# Patient Record
Sex: Female | Born: 1955 | Race: White | Hispanic: No | Marital: Married | Smoking: Never smoker
Health system: Southern US, Community
[De-identification: ages and names within clinical notes are randomized; demographics above are authoritative.]

## PROBLEM LIST (undated history)

## (undated) DIAGNOSIS — F32A Depression, unspecified: Secondary | ICD-10-CM

## (undated) DIAGNOSIS — F329 Major depressive disorder, single episode, unspecified: Secondary | ICD-10-CM

## (undated) DIAGNOSIS — F988 Other specified behavioral and emotional disorders with onset usually occurring in childhood and adolescence: Secondary | ICD-10-CM

## (undated) DIAGNOSIS — Z8619 Personal history of other infectious and parasitic diseases: Secondary | ICD-10-CM

## (undated) DIAGNOSIS — K219 Gastro-esophageal reflux disease without esophagitis: Secondary | ICD-10-CM

## (undated) DIAGNOSIS — G479 Sleep disorder, unspecified: Secondary | ICD-10-CM

## (undated) DIAGNOSIS — C73 Malignant neoplasm of thyroid gland: Secondary | ICD-10-CM

## (undated) DIAGNOSIS — M1990 Unspecified osteoarthritis, unspecified site: Secondary | ICD-10-CM

## (undated) DIAGNOSIS — F419 Anxiety disorder, unspecified: Secondary | ICD-10-CM

## (undated) HISTORY — PX: TONSILLECTOMY: SUR1361

---

## 1997-10-17 ENCOUNTER — Ambulatory Visit (HOSPITAL_COMMUNITY): Admission: RE | Admit: 1997-10-17 | Discharge: 1997-10-17 | Payer: Self-pay

## 1998-09-11 ENCOUNTER — Other Ambulatory Visit: Admission: RE | Admit: 1998-09-11 | Discharge: 1998-09-11 | Payer: Self-pay | Admitting: Obstetrics and Gynecology

## 1999-01-09 ENCOUNTER — Ambulatory Visit (HOSPITAL_COMMUNITY): Admission: RE | Admit: 1999-01-09 | Discharge: 1999-01-09 | Payer: Self-pay | Admitting: Obstetrics and Gynecology

## 1999-01-09 ENCOUNTER — Encounter: Payer: Self-pay | Admitting: Obstetrics and Gynecology

## 1999-10-30 ENCOUNTER — Other Ambulatory Visit: Admission: RE | Admit: 1999-10-30 | Discharge: 1999-10-30 | Payer: Self-pay | Admitting: Obstetrics and Gynecology

## 2000-01-13 ENCOUNTER — Encounter: Payer: Self-pay | Admitting: Internal Medicine

## 2000-01-13 ENCOUNTER — Ambulatory Visit (HOSPITAL_COMMUNITY): Admission: RE | Admit: 2000-01-13 | Discharge: 2000-01-13 | Payer: Self-pay | Admitting: Internal Medicine

## 2000-02-10 ENCOUNTER — Encounter: Admission: RE | Admit: 2000-02-10 | Discharge: 2000-02-10 | Payer: Self-pay | Admitting: Internal Medicine

## 2000-02-10 ENCOUNTER — Encounter: Payer: Self-pay | Admitting: Internal Medicine

## 2000-04-16 ENCOUNTER — Encounter: Payer: Self-pay | Admitting: Gastroenterology

## 2000-04-16 ENCOUNTER — Encounter: Admission: RE | Admit: 2000-04-16 | Discharge: 2000-04-16 | Payer: Self-pay | Admitting: Gastroenterology

## 2000-06-24 ENCOUNTER — Ambulatory Visit (HOSPITAL_COMMUNITY): Admission: RE | Admit: 2000-06-24 | Discharge: 2000-06-24 | Payer: Self-pay | Admitting: Gastroenterology

## 2000-06-24 ENCOUNTER — Encounter (INDEPENDENT_AMBULATORY_CARE_PROVIDER_SITE_OTHER): Payer: Self-pay | Admitting: Specialist

## 2000-08-06 ENCOUNTER — Encounter: Admission: RE | Admit: 2000-08-06 | Discharge: 2000-08-06 | Payer: Self-pay | Admitting: *Deleted

## 2000-08-06 ENCOUNTER — Encounter: Payer: Self-pay | Admitting: Allergy and Immunology

## 2001-01-03 ENCOUNTER — Other Ambulatory Visit: Admission: RE | Admit: 2001-01-03 | Discharge: 2001-01-03 | Payer: Self-pay | Admitting: Obstetrics and Gynecology

## 2001-02-17 ENCOUNTER — Encounter: Payer: Self-pay | Admitting: Obstetrics and Gynecology

## 2001-02-17 ENCOUNTER — Ambulatory Visit (HOSPITAL_COMMUNITY): Admission: RE | Admit: 2001-02-17 | Discharge: 2001-02-17 | Payer: Self-pay | Admitting: Obstetrics and Gynecology

## 2001-03-25 ENCOUNTER — Encounter: Payer: Self-pay | Admitting: Otolaryngology

## 2001-03-25 ENCOUNTER — Encounter: Admission: RE | Admit: 2001-03-25 | Discharge: 2001-03-25 | Payer: Self-pay | Admitting: Otolaryngology

## 2002-03-29 ENCOUNTER — Other Ambulatory Visit: Admission: RE | Admit: 2002-03-29 | Discharge: 2002-03-29 | Payer: Self-pay | Admitting: Obstetrics and Gynecology

## 2002-05-03 ENCOUNTER — Ambulatory Visit (HOSPITAL_COMMUNITY): Admission: RE | Admit: 2002-05-03 | Discharge: 2002-05-03 | Payer: Self-pay | Admitting: Obstetrics and Gynecology

## 2002-05-03 ENCOUNTER — Encounter: Payer: Self-pay | Admitting: Obstetrics and Gynecology

## 2003-02-26 ENCOUNTER — Encounter: Admission: RE | Admit: 2003-02-26 | Discharge: 2003-04-13 | Payer: Self-pay | Admitting: Internal Medicine

## 2003-05-29 ENCOUNTER — Other Ambulatory Visit: Admission: RE | Admit: 2003-05-29 | Discharge: 2003-05-29 | Payer: Self-pay | Admitting: Obstetrics and Gynecology

## 2003-12-13 ENCOUNTER — Encounter: Admission: RE | Admit: 2003-12-13 | Discharge: 2003-12-13 | Payer: Self-pay | Admitting: Obstetrics and Gynecology

## 2004-11-19 ENCOUNTER — Encounter: Admission: RE | Admit: 2004-11-19 | Discharge: 2004-11-19 | Payer: Self-pay | Admitting: Internal Medicine

## 2005-06-16 ENCOUNTER — Ambulatory Visit: Payer: Self-pay | Admitting: Gastroenterology

## 2005-08-07 ENCOUNTER — Ambulatory Visit (HOSPITAL_COMMUNITY): Admission: RE | Admit: 2005-08-07 | Discharge: 2005-08-07 | Payer: Self-pay | Admitting: Gastroenterology

## 2005-08-07 ENCOUNTER — Encounter (INDEPENDENT_AMBULATORY_CARE_PROVIDER_SITE_OTHER): Payer: Self-pay | Admitting: *Deleted

## 2005-12-23 ENCOUNTER — Encounter: Admission: RE | Admit: 2005-12-23 | Discharge: 2005-12-23 | Payer: Self-pay | Admitting: Obstetrics and Gynecology

## 2007-06-06 ENCOUNTER — Encounter: Admission: RE | Admit: 2007-06-06 | Discharge: 2007-06-06 | Payer: Self-pay | Admitting: Internal Medicine

## 2008-07-13 ENCOUNTER — Encounter: Admission: RE | Admit: 2008-07-13 | Discharge: 2008-07-13 | Payer: Self-pay | Admitting: Obstetrics and Gynecology

## 2009-08-13 ENCOUNTER — Encounter: Admission: RE | Admit: 2009-08-13 | Discharge: 2009-08-13 | Payer: Self-pay | Admitting: Internal Medicine

## 2010-12-17 ENCOUNTER — Other Ambulatory Visit: Payer: Self-pay | Admitting: Internal Medicine

## 2010-12-17 DIAGNOSIS — Z1231 Encounter for screening mammogram for malignant neoplasm of breast: Secondary | ICD-10-CM

## 2011-01-05 ENCOUNTER — Ambulatory Visit
Admission: RE | Admit: 2011-01-05 | Discharge: 2011-01-05 | Disposition: A | Discharge status: Home / Self Care | Payer: 59 | Source: Ambulatory Visit | Attending: Internal Medicine | Admitting: Internal Medicine

## 2011-01-05 DIAGNOSIS — Z1231 Encounter for screening mammogram for malignant neoplasm of breast: Secondary | ICD-10-CM

## 2011-01-30 NOTE — Procedures (Signed)
[facility]  Patient:    Mary Key, Mary Key                           MRN: [id number] Proc. Date: 06/24/00 Attending:  Verlin Grills, M.D.                           Procedure Report  PROCEDURE:  Esophagogastroduodenoscopy with gastric biopsy.  REFERRING PHYSICIAN:  Darius Bump, M.D., [facility]  PROCEDURE INDICATION:  Mary Key is a 55 year old female.  She takes Prilosec 20 mg daily to control epigastric dyspeptic-type symptoms.  If she does not take Prilosec, her epigastric discomfort worsens.  She denies dysphagia, odynophagia.  Heartburn is infrequent.  Feb 10, 2000, barium swallow, upper GI x-ray series revealed "heavy folds" in the gastric body and fundus of the stomach and several small polyps in the body of the stomach.  There was no evidence of a hiatal hernia.  There was no evidence of a radiologic-induced gastroesophageal reflux.  July 2001 abdominal ultrasound was normal.  I discussed with Ms. Hustead the complications associated with esophagogastroduodenoscopy including intestinal bleeding and intestinal perforation.  Ms. Nobile has signed the operative permit.  PAST MEDICAL HISTORY:  Chronic viral hepatitis C with normal liver enzymes. Bee sting anaphylaxis, depression, allergic rhinitis/sinusitis, uterine fibroids, tonsillectomy as a child.  CHRONIC MEDICATIONS:  Prozac, Prilosec, Nasonex, Allegra, Vioxx, and glucosamine.  ENDOSCOPIST:  Verlin Grills, M.D.  PREMEDICATION:  Demerol 50 mg, Versed 7 mg.  ENDOSCOPE:  Olympus gastroscope.  DESCRIPTION OF PROCEDURE:  After obtaining informed consent, the patient was placed in the left lateral decubitus position.  I administered intravenous Demerol and intravenous Versed to achieve conscious sedation for the procedure.  The patients blood pressure, oxygen saturation, and cardiac rhythm were monitored throughout the procedure and documented in the  record.  The Olympus gastroscope was passed into the posterior hypopharynx into the proximal esophagus without difficulty.  The hypopharynx, larynx, and vocal cords appeared normal.  Esophagoscopy:  The proximal, mid-, and lower segments of the esophagus appeared normal.  The squamocolumnar junction and the esophagogastric junction are noted at 40 cm from the incisor teeth.  Endoscopically, there is no evidence for the presence of Barretts esophagus, erosive esophagitis, esophageal mucosal scarring, or esophageal ulcers.  Gastroscopy:  Endoscopically there is no evidence of a hiatal hernia. Retroflexed view of the gastric cardia and fundus was normal.  Endoscopic appearance of the gastric body, antrum, and pylorus was normal.  The gastric folds appeared normal.  The gastric mucosa appeared normal.  I did not detect any gastric polyps nor abnormality of the stomach.  Two biopsies were taken from the distal gastric antrum for pathologic antrum to rule out H. pylori antral gastritis.  The gastric antrum and pylorus were otherwise normal.  Duodenoscopy:  The duodenal bulb and descending duodenum appeared normal.  ASSESSMENT:  Normal esophagogastroduodenoscopy.  Biopsies obtained from the distal gastric antrum pending to rule out Helicobacter pylori antral gastritis. DD:  06/24/00 TD:  06/25/00 Job: [id number]

## 2011-10-01 ENCOUNTER — Ambulatory Visit (INDEPENDENT_AMBULATORY_CARE_PROVIDER_SITE_OTHER): Payer: 59 | Admitting: Gastroenterology

## 2011-10-01 DIAGNOSIS — B182 Chronic viral hepatitis C: Secondary | ICD-10-CM

## 2011-10-02 LAB — CBC WITH DIFFERENTIAL/PLATELET
Eosinophils Absolute: 0.1 10*3/uL (ref 0.0–0.7)
Hemoglobin: 14.1 g/dL (ref 12.0–15.0)
Lymphocytes Relative: 46 % (ref 12–46)
Lymphs Abs: 2.8 10*3/uL (ref 0.7–4.0)
MCH: 31.3 pg (ref 26.0–34.0)
Monocytes Relative: 8 % (ref 3–12)
Neutrophils Relative %: 44 % (ref 43–77)
RBC: 4.5 MIL/uL (ref 3.87–5.11)

## 2011-10-02 LAB — AFP TUMOR MARKER: AFP-Tumor Marker: 4.8 ng/mL (ref 0.0–8.0)

## 2011-10-03 LAB — IRON: Iron: 136 ug/dL (ref 42–145)

## 2011-10-03 LAB — COMPLETE METABOLIC PANEL WITH GFR
Albumin: 4.5 g/dL (ref 3.5–5.2)
CO2: 27 mEq/L (ref 19–32)
Calcium: 9.8 mg/dL (ref 8.4–10.5)
Chloride: 104 mEq/L (ref 96–112)
GFR, Est African American: >89 mL/min
GFR, Est Non African American: 85 mL/min
Glucose, Bld: 83 mg/dL (ref 70–99)
Potassium: 4 mEq/L (ref 3.5–5.3)
Sodium: 142 mEq/L (ref 135–145)
Total Protein: 7.2 g/dL (ref 6.0–8.3)

## 2011-10-03 LAB — IBC PANEL
%SAT: 36 % (ref 20–55)
TIBC: 375 ug/dL (ref 250–470)

## 2011-10-08 NOTE — Progress Notes (Signed)
NAME:  Mary Key, Mary Key    MR#:  [id number]      DATE:  10/01/2011  DOB:  02-26-1956    cc: Consulting Physician:  Lenoard Aden, MD, [facility], [address] [place], [place] [address], Phone [phone]  Phillip Heal, MD, [facility], [address] [place], [place], [place] [address], Phone [phone]  Danise Edge, III, MD, [facility] [place], [address], [address], [place], [place] [address], Phone [phone]  Primary care physician:  Same.  Referring Physician:  Kirby Funk, MD, [facility], [address] [place], [address], [place], [place] [address], Fax [phone]    REASON FOR VISIT:  Genotype 1b HCV, IL-28B unknown.   History:  The patient is a 56 year old woman who I have been asked to see in consultation by Dr. Valentina Lucks regarding genotype 1b, IL-28B unknown, HCV.  It will be recalled the patient was seen by me on 07/13/2005, for her hepatitis C. She underwent a liver biopsy on 08/07/2005, showing grade 2 stage zero disease with moderate mixed macrovesicular and  microvesicular steatosis. She was contacted by Lahoma Rocker on 08/25/2005, to discuss results of the biopsy. Because of the lack of fibrosis and her history of depression, treatment was deferred. She  has now been sent back to discuss treatment again.  Currently, the patient has no symptoms referable to her history of hepatitis C. There are no symptoms to suggest cryoglobulin mediated or decompensated liver disease.  With respect to risk factors for liver disease, there is no alcohol use over the the last 11 years since she learned of her diagnosis approximately 11 years ago, but prior to that drank approximately 2  beers per week. 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 There is a history of sterile body piercing. Her family  history is  significant for her sister who has hepatitis C, and has developed hepatocellular carcinoma. She completed her hepatitis A and B vaccinations early in 2006.   PAST MEDICAL HISTORY:  Significant for gastroesophageal reflux disease and seasonal allergies.   PAST SURGICAL HISTORY:  Tonsillectomy and adenectomy the age of 72.    Past psychiatric history:  The patient is being followed by Dr. Phillip Heal for history of depression. She is seen twice a year and is managed with  antidepressants. She has not been hospitalized for depression and there is no history of suicidal behavior.    Current medications:  Fluoxetine, Wellbutrin, buspirone, calcium, multivitamin, and cinnamon. She did not bring a list or the actual bottles with her today.   Allergies:  Denies.   Habits:  Smoking, a few years in her early 88s but then stopped.  Alcohol as above.    Family history:  As above.   SOCIAL HISTORY:  Married with a child. 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   REVIEW OF SYSTEMS:  All 10 systems reviewed today with the patient and they are negative other which is mentioned above. CES-D was 10.   PHYSICAL EXAMINATION:   Constitutional:  Appeared stated age without significant peripheral wasting. Vital signs: Height 67 inches, weight 143 pounds, blood pressure 109/81, pulse of 68, temperature 98.1 Fahrenheit. Ears, nose,  mouth and throat:  Unremarkable oropharynx.  No thyromegaly or neck masses.  Chest:  Resonant to percussion.  Clear to auscultation.  Cardiovascular:  Heart sounds normal S1, S2 without murmurs  or rubs.   There is no peripheral edema.  Abdominal:  Normal bowel sounds.  No masses or tenderness.  I could not appreciate a liver edge or spleen tip.  I could not appreciate any hernias.  Lymphatics:  No cervical or  inguinal lymphadenopathy.  Central Nervous System:  No asterixis or focal neurologic findings.  Dermatologic:  Anicteric without  palmar  erythema or spider angiomata.  Eyes:  Anicteric sclerae.  Pupils are equal and reactive to light.   laboratories:  CBC on 10/22/2010, showed white count of 6.1, hemoglobin 14.2, MCV 92.3, platelet count 245. Her HCV RNA was 9,485,000 international units per mL. AFP was 3.9. Creatinine was 0.72. AST 27, ALT 31, ALP  60, total bilirubin 0.6, albumin 4.3, globulins of 2.8. Triglycerides were 154.   Assessment:  The patient is a 56 year old woman with history genotype 1b hepatitis C, IL-28B unknown, but with a liver biopsy 08/07/2005, showing grade 2 stage zero disease without significant iron, but with moderate mixed  macrovesicular and microvesicular steatosis. It should be noted that the steatosis may have been some element of fatty liver, and she is now 8 pounds lighter than she was. I do not see any contraindication  to treating her with the exception of her psychiatric history. I do not think that this rises to the level of concern, however. It would be of value to have her psychiatrist give an opinion as to her  fitness for treatment from a psychiatric standpoint, however.  In my discussion today with the patient, we discussed the genotype and its implications. We discussed the biopsy results from 2006. We discussed the value of doing another biopsy at this time to see if  there has been any progression of disease, which would prompt [place] to consider treatment earlier rather than later. We discussed how the biopsy is accomplished. She wants to have the biopsy done at  [facility]. We then discussed treatment with pegylated interferon, ribavirin, and a protease inhibitor versus waiting for the availability of the next generation protease inhibitors or other agents. We discussed the specific system, constitutional, and psychiatric side effects of current therapy and the response rates. We discussed the value of waiting for the next  generation agents, as well, including  noninterferon based therapies. Having said all this, the patient would like to proceed with a liver  biopsy to restage her disease, and would be interested in treatment now, should it be warranted.   plan:  1. Standard labs including CBC, CMP, iron studies, hepatitis C RNA,  IL-28B. 2. She has previously been vaccinated for hepatitis A and B by history. 3. Will book for liver biopsy at [facility] at her request, and then follow up will be booked thereafter at [facility], with the timing of followup to be determined based on results of the biopsy findings. 4. By way of this letter to Dr. Madaline Guthrie, I would ask for her to fax me any documentation she feels appropriate as to the patient's fitness from a psychiatric standpoint, to tolerate therapy with interferon. A response can be faxed to [phone].            Brooke Dare, MD    ADDENDUM  HCV RNA [id number]  IU/mL  IL28 B pending.  403 .[id number]  D:  Thu Jan 17 16:52:47 2013 ; T:  Thu Jan 17 20:30:48 2013  Job #:  [id number]

## 2012-06-27 ENCOUNTER — Other Ambulatory Visit: Payer: Self-pay | Admitting: Internal Medicine

## 2012-06-27 DIAGNOSIS — Z1231 Encounter for screening mammogram for malignant neoplasm of breast: Secondary | ICD-10-CM

## 2012-07-19 ENCOUNTER — Other Ambulatory Visit: Payer: Self-pay | Admitting: Obstetrics and Gynecology

## 2012-07-19 DIAGNOSIS — M8580 Other specified disorders of bone density and structure, unspecified site: Secondary | ICD-10-CM

## 2012-07-25 ENCOUNTER — Ambulatory Visit
Admission: RE | Admit: 2012-07-25 | Discharge: 2012-07-25 | Disposition: A | Discharge status: Home / Self Care | Payer: 59 | Source: Ambulatory Visit | Attending: Internal Medicine | Admitting: Internal Medicine

## 2012-07-25 DIAGNOSIS — Z1231 Encounter for screening mammogram for malignant neoplasm of breast: Secondary | ICD-10-CM

## 2012-07-28 ENCOUNTER — Other Ambulatory Visit: Payer: Self-pay | Admitting: Internal Medicine

## 2012-07-28 DIAGNOSIS — R928 Other abnormal and inconclusive findings on diagnostic imaging of breast: Secondary | ICD-10-CM

## 2012-08-03 ENCOUNTER — Other Ambulatory Visit: Payer: 59

## 2012-08-05 ENCOUNTER — Ambulatory Visit
Admission: RE | Admit: 2012-08-05 | Discharge: 2012-08-05 | Disposition: A | Discharge status: Home / Self Care | Payer: 59 | Source: Ambulatory Visit | Attending: Internal Medicine | Admitting: Internal Medicine

## 2012-08-05 DIAGNOSIS — R928 Other abnormal and inconclusive findings on diagnostic imaging of breast: Secondary | ICD-10-CM

## 2012-09-23 ENCOUNTER — Ambulatory Visit
Admission: RE | Admit: 2012-09-23 | Discharge: 2012-09-23 | Disposition: A | Discharge status: Home / Self Care | Payer: 59 | Source: Ambulatory Visit | Attending: Obstetrics and Gynecology | Admitting: Obstetrics and Gynecology

## 2012-09-23 DIAGNOSIS — M8580 Other specified disorders of bone density and structure, unspecified site: Secondary | ICD-10-CM

## 2013-10-16 ENCOUNTER — Other Ambulatory Visit: Payer: Self-pay

## 2013-10-16 DIAGNOSIS — Z1231 Encounter for screening mammogram for malignant neoplasm of breast: Secondary | ICD-10-CM

## 2013-11-06 ENCOUNTER — Ambulatory Visit
Admission: RE | Admit: 2013-11-06 | Discharge: 2013-11-06 | Disposition: A | Discharge status: Home / Self Care | Payer: 59 | Source: Ambulatory Visit

## 2013-11-06 DIAGNOSIS — Z1231 Encounter for screening mammogram for malignant neoplasm of breast: Secondary | ICD-10-CM

## 2015-04-02 ENCOUNTER — Other Ambulatory Visit: Payer: Self-pay | Admitting: Obstetrics and Gynecology

## 2015-04-02 DIAGNOSIS — M810 Age-related osteoporosis without current pathological fracture: Secondary | ICD-10-CM

## 2015-04-09 ENCOUNTER — Other Ambulatory Visit: Payer: Self-pay | Admitting: Obstetrics and Gynecology

## 2015-04-09 DIAGNOSIS — E079 Disorder of thyroid, unspecified: Secondary | ICD-10-CM

## 2015-04-10 ENCOUNTER — Ambulatory Visit
Admission: RE | Admit: 2015-04-10 | Discharge: 2015-04-10 | Disposition: A | Discharge status: Home / Self Care | Payer: 59 | Source: Ambulatory Visit | Attending: Obstetrics and Gynecology | Admitting: Obstetrics and Gynecology

## 2015-04-10 DIAGNOSIS — M810 Age-related osteoporosis without current pathological fracture: Secondary | ICD-10-CM

## 2015-04-18 ENCOUNTER — Ambulatory Visit
Admission: RE | Admit: 2015-04-18 | Discharge: 2015-04-18 | Disposition: A | Discharge status: Home / Self Care | Payer: 59 | Source: Ambulatory Visit | Attending: Obstetrics and Gynecology | Admitting: Obstetrics and Gynecology

## 2015-04-18 DIAGNOSIS — E079 Disorder of thyroid, unspecified: Secondary | ICD-10-CM

## 2015-05-15 ENCOUNTER — Other Ambulatory Visit (HOSPITAL_COMMUNITY)
Admission: RE | Admit: 2015-05-15 | Discharge: 2015-05-15 | Disposition: A | Discharge status: Home / Self Care | Payer: 59 | Source: Ambulatory Visit | Attending: Endocrinology | Admitting: Endocrinology

## 2015-05-15 ENCOUNTER — Encounter: Payer: Self-pay | Admitting: Endocrinology

## 2015-05-15 ENCOUNTER — Ambulatory Visit (INDEPENDENT_AMBULATORY_CARE_PROVIDER_SITE_OTHER): Payer: 59 | Admitting: Endocrinology

## 2015-05-15 VITALS — BP 120/72 | HR 73 | Temp 98.0°F | Ht 66.0 in | Wt 142.0 lb

## 2015-05-15 DIAGNOSIS — E041 Nontoxic single thyroid nodule: Secondary | ICD-10-CM | POA: Insufficient documentation

## 2015-05-15 DIAGNOSIS — E042 Nontoxic multinodular goiter: Secondary | ICD-10-CM | POA: Diagnosis not present

## 2015-05-15 NOTE — Progress Notes (Signed)
   Subjective:    Patient ID: Mary Key, female    DOB: 09/30/1955, 59 y.o.   MRN: [id number]  HPI Pt states few mos of slight fullness at the anterior neck, but no assoc pain.  she is unaware of ever having had thyroid problems in the past.  He has no h/o XRT or surgery to the neck.   No past medical history on file.  No past surgical history on file.  Social History   Social History  . Marital Status: Married    Spouse Name: N/A  . Number of Children: N/A  . Years of Education: N/A   Occupational History  . Not on file.   Social History Main Topics  . Smoking status: Never Smoker   . Smokeless tobacco: Not on file  . Alcohol Use: 0.0 oz/week    0 Standard drinks or equivalent per week  . Drug Use: Not on file  . Sexual Activity: Not on file   Other Topics Concern  . Not on file   Social History Narrative  . No narrative on file    No current outpatient prescriptions on file prior to visit.   No current facility-administered medications on file prior to visit.    No Known Allergies  Family History  Problem Relation Age of Onset  . Thyroid disease Mother     BP 120/72 mmHg  Pulse 73  Temp(Src) 98 F (36.7 C) (Oral)  Ht 5\' 6"  (1.676 m)  Wt 142 lb (64.411 kg)  BMI 22.93 kg/m2  SpO2 95%  Review of Systems denies weight loss, headache, hoarseness, double vision, palpitations, sob, diarrhea, polyuria, muscle weakness, excessive diaphoresis, tremor, anxiety, cold intolerance, and easy bruising.  She has rhinorrhea.      Objective:   Physical Exam VS: see vs page GEN: no distress HEAD: head: no deformity eyes: no periorbital swelling, no proptosis external nose and ears are normal mouth: no lesion seen NECK: supple, thyroid is not enlarged CHEST WALL: no deformity LUNGS:  Clear to auscultation CV: reg rate and rhythm, no murmur MUSCULOSKELETAL: muscle bulk and strength are grossly normal.  no obvious joint swelling.  gait is normal and  steady EXTEMITIES: no deformity. no edema NEURO:  cn 2-12 grossly intact.   readily moves all 4's.  sensation is intact to touch on all 4's.  SKIN:  Normal texture and temperature.  No rash or suspicious lesion is visible.   NODES:  None palpable at the neck PSYCH: alert, well-oriented.  Does not appear anxious nor depressed.   I have reviewed outside records: pt was noted to have thyroid nodule, and [place] was ordered  Radiol thyroid US (04/18/15): Right thyroid lobe: single solid nodule inferiorly measures 2.4 cm x 1.6 cm x 1.8 cm   thyroid needle bx: consent obtained, signed form on chart The area is first sprayed with cooling agent local: xylocaine 2%, with epinephrine prep: alcohol pad 4 bxs are done with 87O needles no complications    Assessment & Plan:  Multinodular goiter, new  Patient is advised the following: Patient Instructions  blood tests are requested for you today.  We'll let you know about the results about this, and of the biopsy result. If no problem is found on the biopsy, please return in 1 year.

## 2015-05-15 NOTE — Patient Instructions (Addendum)
blood tests are requested for you today.  We'll let you know about the results about this, and of the biopsy result. If no problem is found on the biopsy, please return in 1 year.

## 2015-05-16 ENCOUNTER — Other Ambulatory Visit (INDEPENDENT_AMBULATORY_CARE_PROVIDER_SITE_OTHER): Payer: 59

## 2015-05-16 DIAGNOSIS — E042 Nontoxic multinodular goiter: Secondary | ICD-10-CM | POA: Diagnosis not present

## 2015-05-16 LAB — TSH: TSH: 2.03 u[IU]/mL (ref 0.35–4.50)

## 2015-05-17 ENCOUNTER — Other Ambulatory Visit: Payer: Self-pay | Admitting: Endocrinology

## 2015-05-17 DIAGNOSIS — E042 Nontoxic multinodular goiter: Secondary | ICD-10-CM

## 2015-05-21 ENCOUNTER — Encounter: Payer: Self-pay | Admitting: Endocrinology

## 2015-05-29 ENCOUNTER — Ambulatory Visit: Payer: Self-pay | Admitting: Surgery

## 2015-06-07 NOTE — Patient Instructions (Addendum)
YOUR PROCEDURE IS SCHEDULED ON : 06/13/15  REPORT TO [facility] MAIN ENTRANCE FOLLOW SIGNS TO EAST ELEVATOR - GO TO 3rd FLOOR CHECK IN AT 3 EAST NURSES STATION (SHORT STAY) AT:  5:30 AM  CALL THIS NUMBER IF YOU HAVE PROBLEMS THE MORNING OF SURGERY [phone]  REMEMBER:ONLY 1 PER PERSON MAY GO TO SHORT STAY WITH YOU TO GET READY THE MORNING OF YOUR SURGERY  DO NOT EAT FOOD OR DRINK LIQUIDS AFTER MIDNIGHT  TAKE THESE MEDICINES THE MORNING OF SURGERY: WELLBUTRIN / PROZAC  STOP ASPIRIN / IBUPROFEN / ALEVE / VITAMINS / HERBAL MEDS __5__ DAYS BEFORE SURGERY  YOU MAY NOT HAVE ANY METAL ON YOUR BODY INCLUDING HAIR PINS AND PIERCING'S. DO NOT WEAR JEWELRY, MAKEUP, LOTIONS, POWDERS OR PERFUMES. DO NOT WEAR NAIL POLISH. DO NOT SHAVE 48 HRS PRIOR TO SURGERY. MEN MAY SHAVE FACE AND NECK.  DO NOT [facility]. [facility] IS NOT RESPONSIBLE FOR VALUABLES.  CONTACTS, DENTURES OR PARTIALS MAY NOT BE WORN TO SURGERY. LEAVE SUITCASE IN CAR. CAN BE BROUGHT TO ROOM AFTER SURGERY.  PATIENTS DISCHARGED THE DAY OF SURGERY WILL NOT BE ALLOWED TO DRIVE HOME.  PLEASE READ OVER THE FOLLOWING INSTRUCTION SHEETS _________________________________________________________________________________                                          [facility] - PREPARING FOR SURGERY  Before surgery, you can play an important role.  Because skin is not sterile, your skin needs to be as free of germs as possible.  You can reduce the number of germs on your skin by washing with CHG (chlorahexidine gluconate) soap before surgery.  CHG is an antiseptic cleaner which kills germs and bonds with the skin to continue killing germs even after washing. Please DO NOT use if you have an allergy to CHG or antibacterial soaps.  If your skin becomes reddened/irritated stop using the CHG and inform your nurse when you arrive at Short Stay. Do not shave (including legs and underarms) for at least 48 hours  prior to the first CHG shower.  You may shave your face. Please follow these instructions carefully:   1.  Shower with CHG Soap the night before surgery and the  morning of Surgery.   2.  If you choose to wash your hair, wash your hair first as usual with your  normal  Shampoo.   3.  After you shampoo, rinse your hair and body thoroughly to remove the  shampoo.                                         4.  Use CHG as you would any other liquid soap.  You can apply chg directly  to the skin and wash . Gently wash with scrungie or clean wascloth    5.  Apply the CHG Soap to your body ONLY FROM THE NECK DOWN.   Do not use on open                           Wound or open sores. Avoid contact with eyes, ears mouth and genitals (private parts).  Genitals (private parts) with your normal soap.              6.  Wash thoroughly, paying special attention to the area where your surgery  will be performed.   7.  Thoroughly rinse your body with warm water from the neck down.   8.  DO NOT shower/wash with your normal soap after using and rinsing off  the CHG Soap .                9.  Pat yourself dry with a clean towel.             10.  Wear clean night clothes to bed after shower             11.  Place clean sheets on your bed the night of your first shower and do not  sleep with pets.  Day of Surgery : Do not apply any lotions/deodorants the morning of surgery.  Please wear clean clothes to the hospital/surgery center.  FAILURE TO FOLLOW THESE INSTRUCTIONS MAY RESULT IN THE CANCELLATION OF YOUR SURGERY    PATIENT SIGNATURE_________________________________  ______________________________________________________________________

## 2015-06-11 ENCOUNTER — Ambulatory Visit (HOSPITAL_COMMUNITY)
Admission: RE | Admit: 2015-06-11 | Discharge: 2015-06-11 | Disposition: A | Discharge status: Home / Self Care | Payer: 59 | Source: Ambulatory Visit | Attending: Anesthesiology | Admitting: Anesthesiology

## 2015-06-11 ENCOUNTER — Encounter (HOSPITAL_COMMUNITY): Payer: Self-pay

## 2015-06-11 ENCOUNTER — Encounter (HOSPITAL_COMMUNITY)
Admission: RE | Admit: 2015-06-11 | Discharge: 2015-06-11 | Disposition: A | Discharge status: Home / Self Care | Payer: 59 | Source: Ambulatory Visit | Attending: Surgery | Admitting: Surgery

## 2015-06-11 DIAGNOSIS — C73 Malignant neoplasm of thyroid gland: Secondary | ICD-10-CM | POA: Diagnosis not present

## 2015-06-11 DIAGNOSIS — Z01818 Encounter for other preprocedural examination: Secondary | ICD-10-CM | POA: Insufficient documentation

## 2015-06-11 DIAGNOSIS — Z01812 Encounter for preprocedural laboratory examination: Secondary | ICD-10-CM | POA: Insufficient documentation

## 2015-06-11 DIAGNOSIS — R05 Cough: Secondary | ICD-10-CM | POA: Diagnosis not present

## 2015-06-11 HISTORY — DX: Other specified behavioral and emotional disorders with onset usually occurring in childhood and adolescence: F98.8

## 2015-06-11 HISTORY — DX: Sleep disorder, unspecified: G47.9

## 2015-06-11 HISTORY — DX: Unspecified osteoarthritis, unspecified site: M19.90

## 2015-06-11 HISTORY — DX: Depression, unspecified: F32.A

## 2015-06-11 HISTORY — DX: Malignant neoplasm of thyroid gland: C73

## 2015-06-11 HISTORY — DX: Personal history of other infectious and parasitic diseases: Z86.19

## 2015-06-11 HISTORY — DX: Major depressive disorder, single episode, unspecified: F32.9

## 2015-06-11 HISTORY — DX: Gastro-esophageal reflux disease without esophagitis: K21.9

## 2015-06-11 HISTORY — DX: Anxiety disorder, unspecified: F41.9

## 2015-06-11 LAB — CBC
HEMATOCRIT: 43.9 % (ref 36.0–46.0)
Hemoglobin: 14.5 g/dL (ref 12.0–15.0)
MCH: 31 pg (ref 26.0–34.0)
MCHC: 33 g/dL (ref 30.0–36.0)
MCV: 93.8 fL (ref 78.0–100.0)
Platelets: 220 10*3/uL (ref 150–400)
RBC: 4.68 MIL/uL (ref 3.87–5.11)
RDW: 12.6 % (ref 11.5–15.5)
WBC: 5.3 10*3/uL (ref 4.0–10.5)

## 2015-06-11 LAB — BASIC METABOLIC PANEL
ANION GAP: 6 (ref 5–15)
BUN: 19 mg/dL (ref 6–20)
CALCIUM: 10 mg/dL (ref 8.9–10.3)
CO2: 30 mmol/L (ref 22–32)
Chloride: 104 mmol/L (ref 101–111)
Creatinine, Ser: 0.71 mg/dL (ref 0.44–1.00)
GFR calc Af Amer: >60 mL/min (ref >60)
GFR calc non Af Amer: >60 mL/min (ref >60)
GLUCOSE: 94 mg/dL (ref 65–99)
POTASSIUM: 4.6 mmol/L (ref 3.5–5.1)
Sodium: 140 mmol/L (ref 135–145)

## 2015-06-12 ENCOUNTER — Encounter (HOSPITAL_COMMUNITY): Payer: Self-pay | Admitting: Surgery

## 2015-06-12 DIAGNOSIS — C73 Malignant neoplasm of thyroid gland: Secondary | ICD-10-CM | POA: Diagnosis present

## 2015-06-12 NOTE — H&P (Signed)
General Surgery [facility]  Sunday Corn DOB: 1955/10/23 Married / Language: English / Race: White Female  History of Present Illness  The patient is a 59 year old female who presents with thyroid cancer. Patient is referred by Dr. Renato Shin for newly diagnosed papillary thyroid carcinoma. Patient had noted on self-examination approximately 6 weeks ago a nodule in the anterior right neck. She demonstrated this to her gynecologist and was sent for a thyroid ultrasound. Ultrasound was performed on April 18, 2015 and showed a slightly enlarged thyroid gland with a 2.4 cm nodule in the right thyroid lobe and a 1.6 cm nodule in the left thyroid lobe. Subsequent fine-needle aspiration of the right thyroid nodule was performed. 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 Patient has no prior history of thyroid disease. She has never been on thyroid medication. She has had no prior head or neck surgery. There is a family history of hypothyroidism and the patient's mother. There is also a history of pancreatic cancer in the patient's sister. There is no family history of other endocrine neoplasm. Patient denies tremors or palpitations. She denies any compressive symptoms. Of note the patient has been treated successfully for hepatitis C.   Other Problems Anxiety Disorder Arthritis Back Pain Depression Gastroesophageal Reflux Disease Hemorrhoids Hepatitis  Past Surgical History Colon Polyp Removal - Colonoscopy Oral Surgery Tonsillectomy  Diagnostic Studies History Colonoscopy 5-10 years ago Mammogram within last year Pap Smear 1-5 years ago  Allergies No Known Drug Allergies09/14/2016  Medication History Zolpidem Tartrate (10MG  Tablet, Oral) Active. Montelukast Sodium (10MG  Tablet, Oral) Active. Tretinoin (0.025% Cream, External) Active. FLUoxetine HCl (10MG   Tablet, Oral) Active. Triamcinolone Acetonide (0.1% Cream, External) Active. Amphetamine-Dextroamphetamine (10MG  Tablet, Oral) Active. BusPIRone HCl (15MG  Tablet, Oral) Active. EpiPen 2-Pak (0.3MG /0.3ML Soln Auto-inj, Injection) Active. Estrace (0.1MG /GM Cream, Vaginal) Active. Medications Reconciled  Social History Alcohol use Occasional alcohol use. Caffeine use Coffee. Illicit drug use Uses socially only. Tobacco use Never smoker.  Family History Arthritis Family Members In [place] Father, Sister. Depression Mother. Heart Disease Father. Malignant Neoplasm Of Pancreas Sister. Thyroid problems Mother.  Pregnancy / Birth History Age at menarche 37 years. Age of menopause 51-55 Contraceptive History Oral contraceptives. Gravida 3 Irregular periods Maternal age 69-20 Para 1  Review of Systems General Not Present- Appetite Loss, Chills, Fatigue, Fever, Night Sweats, Weight Gain and Weight Loss. Skin Not Present- Change in Wart/Mole, Dryness, Hives, Jaundice, New Lesions, Non-Healing Wounds, Rash and Ulcer. HEENT Present- Hearing Loss, Ringing in the Ears, Seasonal Allergies, Sinus Pain and Wears glasses/contact lenses. Not Present- Earache, Hoarseness, Nose Bleed, Oral Ulcers, Sore Throat, Visual Disturbances and Yellow Eyes. Respiratory Present- Chronic Cough. Not Present- Bloody sputum, Difficulty Breathing, Snoring and Wheezing. Breast Not Present- Breast Mass, Breast Pain, Nipple Discharge and Skin Changes. Cardiovascular Not Present- Chest Pain, Difficulty Breathing Lying Down, Leg Cramps, Palpitations, Rapid Heart Rate, Shortness of Breath and Swelling of Extremities. Gastrointestinal Present- Hemorrhoids. Not Present- Abdominal Pain, Bloating, Bloody Stool, Change in Bowel Habits, Chronic diarrhea, Constipation, Difficulty Swallowing, Excessive gas, Gets full quickly at meals, Indigestion, Nausea, Rectal Pain and Vomiting. Female  Genitourinary Not Present- Frequency, Nocturia, Painful Urination, Pelvic Pain and Urgency. Musculoskeletal Present- Joint Pain and Joint Stiffness. Not Present- Back Pain, Muscle Pain, Muscle Weakness and Swelling of Extremities. Neurological Not Present- Decreased Memory, Fainting, Headaches, Numbness, Seizures, Tingling, Tremor, Trouble walking and Weakness. Psychiatric Present- Anxiety. Not Present- Bipolar, Change in Sleep Pattern, Depression, Fearful  and Frequent crying. Endocrine Not Present- Cold Intolerance, Excessive Hunger, Hair Changes, Heat Intolerance, Hot flashes and New Diabetes. Hematology Not Present- Easy Bruising, Excessive bleeding, Gland problems, HIV and Persistent Infections.   Vitals Weight: 141 lb Height: 66in Body Surface Area: 1.73 m Body Mass Index: 22.76 kg/m Temp.: 57F(Temporal)  Pulse: 81 (Regular)  BP: 126/79 (Sitting, Left Arm, Standard)    Physical Exam  General - appears comfortable, no distress; not diaphorectic  HEENT - normocephalic; sclerae clear, gaze conjugate; mucous membranes moist, dentition good; voice normal  Neck - asymmetric on extension; no palpable anterior or posterior cervical adenopathy; firm smooth 2 cm nodule in the anterior right thyroid lobe, mobile with swallowing, nontender; left thyroid lobe without palpable abnormality  Chest - clear bilaterally without rhonchi, rales, or wheeze  Cor - regular rhythm with normal rate; no significant murmur  Ext - non-tender without significant edema or lymphedema  Neuro - grossly intact; no tremor   Assessment & Plan  PAPILLARY THYROID CARCINOMA (C73)  Patient is referred with suspected papillary thyroid carcinoma by her endocrinologist. Patient and her husband are provided with written literature on thyroid surgery to review at home.  Patient has a likely 2.4 cm papillary thyroid carcinoma the right thyroid lobe. She does have an additional nodule in the left thyroid  lobe which has not been sampled. After extensive discussion, I have recommended total thyroidectomy with limited lymph node dissection for management. We have discussed the risk and benefits of the procedure including the risk of recurrent laryngeal nerve injury and injury to parathyroid glands. We discussed the hospital stay to be anticipated. We have discussed the postoperative recovery and return to work. We have discussed a lifelong need for thyroid hormone replacement. We have discussed the probable need for radioactive iodine treatment. Patient and her husband understand and wish to proceed in the near future.  The risks and benefits of the procedure have been discussed at length with the patient. The patient understands the proposed procedure, potential alternative treatments, and the course of recovery to be expected. All of the patient's questions have been answered at this time. The patient wishes to proceed with surgery.  Earnstine Regal, MD, [facility] Office: [phone]

## 2015-06-12 NOTE — Anesthesia Preprocedure Evaluation (Addendum)
Anesthesia Evaluation  Patient identified by MRN, date of birth, ID band Patient awake    Reviewed: Allergy & Precautions, NPO status , Patient's Chart, lab work & pertinent test results  Airway Mallampati: II  TM Distance: >3 FB Neck ROM: Full    Dental  (+) Teeth Intact   Pulmonary neg pulmonary ROS,    breath sounds clear to auscultation       Cardiovascular negative cardio ROS   Rhythm:Regular Rate:Normal     Neuro/Psych PSYCHIATRIC DISORDERS Anxiety Depression negative neurological ROS     GI/Hepatic Neg liver ROS, GERD  ,  Endo/Other  negative endocrine ROS  Renal/GU negative Renal ROS  negative genitourinary   Musculoskeletal  (+) Arthritis ,   Abdominal   Peds negative pediatric ROS (+)  Hematology negative hematology ROS (+)   Anesthesia Other Findings   Reproductive/Obstetrics negative OB ROS                            Lab Results  Component Value Date   WBC 5.3 06/11/2015   HGB 14.5 06/11/2015   HCT 43.9 06/11/2015   MCV 93.8 06/11/2015   PLT 220 06/11/2015   Lab Results  Component Value Date   CREATININE 0.71 06/11/2015   BUN 19 06/11/2015   NA 140 06/11/2015   K 4.6 06/11/2015   CL 104 06/11/2015   CO2 30 06/11/2015   Lab Results  Component Value Date   INR 1.02 10/01/2011     Anesthesia Physical Anesthesia Plan  ASA: II  Anesthesia Plan: General   Post-op Pain Management:    Induction: Intravenous  Airway Management Planned: Oral ETT  Additional Equipment:   Intra-op Plan:   Post-operative Plan: Extubation in OR  Informed Consent: I have reviewed the patients History and Physical, chart, labs and discussed the procedure including the risks, benefits and alternatives for the proposed anesthesia with the patient or authorized representative who has indicated his/her understanding and acceptance.   Dental advisory given  Plan Discussed with:  CRNA  Anesthesia Plan Comments:         Anesthesia Quick Evaluation

## 2015-06-13 ENCOUNTER — Ambulatory Visit (HOSPITAL_COMMUNITY): Payer: 59 | Admitting: Anesthesiology

## 2015-06-13 ENCOUNTER — Observation Stay (HOSPITAL_COMMUNITY)
Admission: RE | Admit: 2015-06-13 | Discharge: 2015-06-14 | Disposition: A | Discharge status: Home / Self Care | Payer: 59 | Source: Ambulatory Visit | Attending: Surgery | Admitting: Surgery

## 2015-06-13 ENCOUNTER — Encounter (HOSPITAL_COMMUNITY): Payer: Self-pay | Admitting: *Deleted

## 2015-06-13 ENCOUNTER — Encounter (HOSPITAL_COMMUNITY)
Admission: RE | Disposition: A | Discharge status: Home / Self Care | Payer: Self-pay | Source: Ambulatory Visit | Attending: Surgery

## 2015-06-13 DIAGNOSIS — Z91030 Bee allergy status: Secondary | ICD-10-CM | POA: Insufficient documentation

## 2015-06-13 DIAGNOSIS — F419 Anxiety disorder, unspecified: Secondary | ICD-10-CM | POA: Diagnosis not present

## 2015-06-13 DIAGNOSIS — Z888 Allergy status to other drugs, medicaments and biological substances status: Secondary | ICD-10-CM | POA: Insufficient documentation

## 2015-06-13 DIAGNOSIS — K759 Inflammatory liver disease, unspecified: Secondary | ICD-10-CM | POA: Insufficient documentation

## 2015-06-13 DIAGNOSIS — F329 Major depressive disorder, single episode, unspecified: Secondary | ICD-10-CM | POA: Insufficient documentation

## 2015-06-13 DIAGNOSIS — Z91010 Allergy to peanuts: Secondary | ICD-10-CM | POA: Insufficient documentation

## 2015-06-13 DIAGNOSIS — C770 Secondary and unspecified malignant neoplasm of lymph nodes of head, face and neck: Secondary | ICD-10-CM | POA: Insufficient documentation

## 2015-06-13 DIAGNOSIS — C73 Malignant neoplasm of thyroid gland: Principal | ICD-10-CM | POA: Insufficient documentation

## 2015-06-13 DIAGNOSIS — Z800 Family history of malignant neoplasm of digestive organs: Secondary | ICD-10-CM | POA: Diagnosis not present

## 2015-06-13 DIAGNOSIS — M1990 Unspecified osteoarthritis, unspecified site: Secondary | ICD-10-CM | POA: Insufficient documentation

## 2015-06-13 DIAGNOSIS — K219 Gastro-esophageal reflux disease without esophagitis: Secondary | ICD-10-CM | POA: Insufficient documentation

## 2015-06-13 HISTORY — PX: THYROIDECTOMY: SHX17

## 2015-06-13 SURGERY — THYROIDECTOMY
Anesthesia: General | Site: Neck

## 2015-06-13 MED ORDER — MIDAZOLAM HCL 5 MG/5ML IJ SOLN
INTRAMUSCULAR | Status: DC | PRN
  Administered 2015-06-13: 2 mg via INTRAVENOUS

## 2015-06-13 MED ORDER — POLYETHYLENE GLYCOL 3350 17 G PO PACK: 17.0000 g | PACK | Freq: Two times a day (BID) | ORAL | Status: DC | PRN

## 2015-06-13 MED ORDER — ONDANSETRON HCL 4 MG/2ML IJ SOLN
INTRAMUSCULAR | Status: AC
  Filled 2015-06-13: qty 2

## 2015-06-13 MED ORDER — LIP MEDEX EX OINT
1.0000 "application " | TOPICAL_OINTMENT | Freq: Two times a day (BID) | CUTANEOUS | Status: DC
  Administered 2015-06-14: 1 via TOPICAL

## 2015-06-13 MED ORDER — BUSPIRONE HCL 15 MG PO TABS: 7.5000 mg | ORAL_TABLET | Freq: Two times a day (BID) | ORAL | Status: DC

## 2015-06-13 MED ORDER — AMPHETAMINE-DEXTROAMPHETAMINE 10 MG PO TABS
10.0000 mg | ORAL_TABLET | Freq: Every day | ORAL | Status: DC
  Filled 2015-06-13: qty 1

## 2015-06-13 MED ORDER — LORAZEPAM 2 MG/ML IJ SOLN: 0.5000 mg | Freq: Three times a day (TID) | INTRAMUSCULAR | Status: DC | PRN

## 2015-06-13 MED ORDER — ZOLPIDEM TARTRATE 5 MG PO TABS: 5.0000 mg | ORAL_TABLET | Freq: Every evening | ORAL | Status: DC | PRN

## 2015-06-13 MED ORDER — CALCIUM CARBONATE 1250 (500 CA) MG PO TABS
2.0000 | ORAL_TABLET | Freq: Three times a day (TID) | ORAL | Status: DC
  Administered 2015-06-13 – 2015-06-14 (×3): 1000 mg via ORAL
  Filled 2015-06-13 (×6): qty 2

## 2015-06-13 MED ORDER — KCL IN DEXTROSE-NACL 20-5-0.45 MEQ/L-%-% IV SOLN
INTRAVENOUS | Status: DC
  Administered 2015-06-13: 15:00:00 via INTRAVENOUS
  Filled 2015-06-13 (×2): qty 1000

## 2015-06-13 MED ORDER — MEPERIDINE HCL 50 MG/ML IJ SOLN: 6.2500 mg | INTRAMUSCULAR | Status: DC | PRN

## 2015-06-13 MED ORDER — NEOSTIGMINE METHYLSULFATE 10 MG/10ML IV SOLN
INTRAVENOUS | Status: AC
  Filled 2015-06-13: qty 1

## 2015-06-13 MED ORDER — ROCURONIUM BROMIDE 100 MG/10ML IV SOLN
INTRAVENOUS | Status: AC
  Filled 2015-06-13: qty 1

## 2015-06-13 MED ORDER — BUSPIRONE HCL 15 MG PO TABS
15.0000 mg | ORAL_TABLET | Freq: Every day | ORAL | Status: DC
  Filled 2015-06-13 (×2): qty 1

## 2015-06-13 MED ORDER — HYDROMORPHONE HCL 1 MG/ML IJ SOLN
0.2500 mg | INTRAMUSCULAR | Status: DC | PRN
  Administered 2015-06-13 (×4): 0.5 mg via INTRAVENOUS

## 2015-06-13 MED ORDER — METOCLOPRAMIDE HCL 5 MG/ML IJ SOLN: 5.0000 mg | Freq: Four times a day (QID) | INTRAMUSCULAR | Status: DC | PRN

## 2015-06-13 MED ORDER — BISACODYL 10 MG RE SUPP: 10.0000 mg | Freq: Two times a day (BID) | RECTAL | Status: DC | PRN

## 2015-06-13 MED ORDER — GLYCOPYRROLATE 0.2 MG/ML IJ SOLN
INTRAMUSCULAR | Status: DC | PRN
  Administered 2015-06-13: 0.6 mg via INTRAVENOUS

## 2015-06-13 MED ORDER — MENTHOL 3 MG MT LOZG: 1.0000 | LOZENGE | OROMUCOSAL | Status: DC | PRN

## 2015-06-13 MED ORDER — ONDANSETRON HCL 4 MG/2ML IJ SOLN
INTRAMUSCULAR | Status: DC | PRN
Start: 2015-06-13 — End: 2015-06-13
  Administered 2015-06-13: 4 mg via INTRAVENOUS

## 2015-06-13 MED ORDER — DEXAMETHASONE SODIUM PHOSPHATE 10 MG/ML IJ SOLN
INTRAMUSCULAR | Status: AC
Start: 2015-06-13 — End: 2015-06-13
  Filled 2015-06-13: qty 1

## 2015-06-13 MED ORDER — PHENOL 1.4 % MT LIQD: 2.0000 | OROMUCOSAL | Status: DC | PRN

## 2015-06-13 MED ORDER — ACETAMINOPHEN 325 MG PO TABS: 650.0000 mg | ORAL_TABLET | Freq: Four times a day (QID) | ORAL | Status: DC | PRN

## 2015-06-13 MED ORDER — LACTATED RINGERS IV SOLN
INTRAVENOUS | Status: DC | PRN
  Administered 2015-06-13 (×2): via INTRAVENOUS

## 2015-06-13 MED ORDER — ONDANSETRON 4 MG PO TBDP: 4.0000 mg | ORAL_TABLET | Freq: Four times a day (QID) | ORAL | Status: DC | PRN

## 2015-06-13 MED ORDER — ONDANSETRON HCL 4 MG/2ML IJ SOLN
4.0000 mg | Freq: Four times a day (QID) | INTRAMUSCULAR | Status: DC | PRN
  Administered 2015-06-14: 4 mg via INTRAVENOUS
  Filled 2015-06-13: qty 2

## 2015-06-13 MED ORDER — METOPROLOL TARTRATE 1 MG/ML IV SOLN
5.0000 mg | Freq: Four times a day (QID) | INTRAVENOUS | Status: DC | PRN
  Filled 2015-06-13: qty 5

## 2015-06-13 MED ORDER — FENTANYL CITRATE (PF) 250 MCG/5ML IJ SOLN
INTRAMUSCULAR | Status: AC
  Filled 2015-06-13: qty 25

## 2015-06-13 MED ORDER — MIDAZOLAM HCL 2 MG/2ML IJ SOLN
INTRAMUSCULAR | Status: AC
  Filled 2015-06-13: qty 4

## 2015-06-13 MED ORDER — CEFAZOLIN SODIUM-DEXTROSE 2-3 GM-% IV SOLR
2.0000 g | INTRAVENOUS | Status: AC
  Administered 2015-06-13: 2 g via INTRAVENOUS

## 2015-06-13 MED ORDER — HYDROMORPHONE HCL 1 MG/ML IJ SOLN
1.0000 mg | INTRAMUSCULAR | Status: DC | PRN
  Filled 2015-06-13 (×2): qty 1

## 2015-06-13 MED ORDER — PROMETHAZINE HCL 25 MG/ML IJ SOLN: 6.2500 mg | INTRAMUSCULAR | Status: DC | PRN

## 2015-06-13 MED ORDER — AMPHETAMINE-DEXTROAMPHETAMINE 10 MG PO TABS: 5.0000 mg | ORAL_TABLET | Freq: Every day | ORAL | Status: DC

## 2015-06-13 MED ORDER — ONDANSETRON HCL 4 MG PO TABS: 4.0000 mg | ORAL_TABLET | Freq: Four times a day (QID) | ORAL | Status: DC | PRN

## 2015-06-13 MED ORDER — LABETALOL HCL 5 MG/ML IV SOLN
INTRAVENOUS | Status: DC | PRN
  Administered 2015-06-13: 2.5 mg via INTRAVENOUS

## 2015-06-13 MED ORDER — PROMETHAZINE HCL 25 MG/ML IJ SOLN
12.5000 mg | INTRAMUSCULAR | Status: DC | PRN
  Administered 2015-06-13 (×2): 12.5 mg via INTRAVENOUS
  Filled 2015-06-13 (×2): qty 1

## 2015-06-13 MED ORDER — GLYCOPYRROLATE 0.2 MG/ML IJ SOLN
INTRAMUSCULAR | Status: AC
  Filled 2015-06-13: qty 2

## 2015-06-13 MED ORDER — DEXAMETHASONE SODIUM PHOSPHATE 10 MG/ML IJ SOLN
INTRAMUSCULAR | Status: DC | PRN
  Administered 2015-06-13: 10 mg via INTRAVENOUS

## 2015-06-13 MED ORDER — NEOSTIGMINE METHYLSULFATE 10 MG/10ML IV SOLN
INTRAVENOUS | Status: DC | PRN
  Administered 2015-06-13: 3 mg via INTRAVENOUS

## 2015-06-13 MED ORDER — LACTATED RINGERS IV BOLUS (SEPSIS)
1000.0000 mL | Freq: Once | INTRAVENOUS | Status: AC
  Administered 2015-06-14: 1000 mL via INTRAVENOUS

## 2015-06-13 MED ORDER — OXYCODONE HCL 5 MG PO TABS
5.0000 mg | ORAL_TABLET | ORAL | Status: DC | PRN
  Administered 2015-06-14: 10 mg via ORAL
  Filled 2015-06-13: qty 2

## 2015-06-13 MED ORDER — PROPOFOL 10 MG/ML IV BOLUS
INTRAVENOUS | Status: AC
  Filled 2015-06-13: qty 20

## 2015-06-13 MED ORDER — FENTANYL CITRATE (PF) 100 MCG/2ML IJ SOLN
INTRAMUSCULAR | Status: DC | PRN
  Administered 2015-06-13 (×2): 50 ug via INTRAVENOUS
  Administered 2015-06-13: 100 ug via INTRAVENOUS
  Administered 2015-06-13: 50 ug via INTRAVENOUS

## 2015-06-13 MED ORDER — MAGIC MOUTHWASH
15.0000 mL | Freq: Four times a day (QID) | ORAL | Status: DC | PRN
  Filled 2015-06-13: qty 15

## 2015-06-13 MED ORDER — LIDOCAINE HCL (CARDIAC) 20 MG/ML IV SOLN
INTRAVENOUS | Status: DC | PRN
  Administered 2015-06-13: 75 mg via INTRAVENOUS
  Administered 2015-06-13: 25 mg via INTRATRACHEAL

## 2015-06-13 MED ORDER — DIPHENHYDRAMINE HCL 50 MG/ML IJ SOLN: 12.5000 mg | Freq: Four times a day (QID) | INTRAMUSCULAR | Status: DC | PRN

## 2015-06-13 MED ORDER — HYDROMORPHONE HCL 1 MG/ML IJ SOLN
INTRAMUSCULAR | Status: AC
  Filled 2015-06-13: qty 1

## 2015-06-13 MED ORDER — LIDOCAINE HCL (CARDIAC) 20 MG/ML IV SOLN
INTRAVENOUS | Status: AC
  Filled 2015-06-13: qty 5

## 2015-06-13 MED ORDER — FENTANYL CITRATE (PF) 100 MCG/2ML IJ SOLN: 25.0000 ug | INTRAMUSCULAR | Status: DC | PRN

## 2015-06-13 MED ORDER — HYDROCODONE-ACETAMINOPHEN 5-325 MG PO TABS: 1.0000 | ORAL_TABLET | ORAL | Status: DC | PRN

## 2015-06-13 MED ORDER — CEFAZOLIN SODIUM-DEXTROSE 2-3 GM-% IV SOLR
INTRAVENOUS | Status: AC
  Filled 2015-06-13: qty 50

## 2015-06-13 MED ORDER — ACETAMINOPHEN 650 MG RE SUPP: 650.0000 mg | Freq: Four times a day (QID) | RECTAL | Status: DC | PRN

## 2015-06-13 MED ORDER — 0.9 % SODIUM CHLORIDE (POUR BTL) OPTIME
TOPICAL | Status: DC | PRN
  Administered 2015-06-13: 1000 mL

## 2015-06-13 MED ORDER — ROCURONIUM BROMIDE 100 MG/10ML IV SOLN
INTRAVENOUS | Status: DC | PRN
  Administered 2015-06-13: 50 mg via INTRAVENOUS

## 2015-06-13 MED ORDER — AMPHETAMINE-DEXTROAMPHETAMINE 10 MG PO TABS: 5.0000 mg | ORAL_TABLET | Freq: Two times a day (BID) | ORAL | Status: DC

## 2015-06-13 MED ORDER — BUSPIRONE HCL 15 MG PO TABS
7.5000 mg | ORAL_TABLET | Freq: Every day | ORAL | Status: DC
  Filled 2015-06-13 (×2): qty 1

## 2015-06-13 MED ORDER — SODIUM CHLORIDE 0.9 % IV SOLN
8.0000 mg | Freq: Four times a day (QID) | INTRAVENOUS | Status: DC | PRN
  Filled 2015-06-13: qty 4

## 2015-06-13 MED ORDER — PROPOFOL 10 MG/ML IV BOLUS
INTRAVENOUS | Status: DC | PRN
  Administered 2015-06-13: 170 mg via INTRAVENOUS

## 2015-06-13 MED ORDER — LACTATED RINGERS IV BOLUS (SEPSIS): 1000.0000 mL | Freq: Three times a day (TID) | INTRAVENOUS | Status: DC | PRN

## 2015-06-13 MED ORDER — LACTATED RINGERS IV SOLN: INTRAVENOUS | Status: DC

## 2015-06-13 MED ORDER — FLUOXETINE HCL 10 MG PO CAPS
10.0000 mg | ORAL_CAPSULE | Freq: Every morning | ORAL | Status: DC
  Filled 2015-06-13 (×2): qty 1

## 2015-06-13 MED ORDER — ALUM & MAG HYDROXIDE-SIMETH 200-200-20 MG/5ML PO SUSP: 30.0000 mL | Freq: Four times a day (QID) | ORAL | Status: DC | PRN

## 2015-06-13 MED ORDER — SUCCINYLCHOLINE CHLORIDE 20 MG/ML IJ SOLN
INTRAMUSCULAR | Status: DC | PRN
  Administered 2015-06-13: 100 mg via INTRAVENOUS

## 2015-06-13 SURGICAL SUPPLY — 38 items
ATTRACTOMAT 16X20 MAGNETIC DRP (DRAPES) ×2 IMPLANT
BENZOIN TINCTURE PRP APPL 2/3 (GAUZE/BANDAGES/DRESSINGS) ×2 IMPLANT
BLADE HEX COATED 2.75 (ELECTRODE) ×2 IMPLANT
BLADE SURG 15 STRL LF DISP TIS (BLADE) ×1 IMPLANT
BLADE SURG 15 STRL SS (BLADE) ×1
CHLORAPREP W/TINT 26ML (MISCELLANEOUS) ×2 IMPLANT
CLIP TI MEDIUM 6 (CLIP) ×4 IMPLANT
CLIP TI WIDE RED SMALL 6 (CLIP) ×8 IMPLANT
CLOSURE STERI-STRIP 1/4X4 (GAUZE/BANDAGES/DRESSINGS) ×2 IMPLANT
COVER SURGICAL LIGHT HANDLE (MISCELLANEOUS) ×2 IMPLANT
DISSECTOR ROUND CHERRY 3/8 STR (MISCELLANEOUS) IMPLANT
DRAPE LAPAROTOMY T 98X78 PEDS (DRAPES) ×2 IMPLANT
DRESSING SURGICEL FIBRLLR 1X2 (HEMOSTASIS) ×1 IMPLANT
DRSG SURGICEL FIBRILLAR 1X2 (HEMOSTASIS) ×2
ELECT PENCIL ROCKER SW 15FT (MISCELLANEOUS) ×2 IMPLANT
ELECT REM PT RETURN 9FT ADLT (ELECTROSURGICAL) ×2
ELECTRODE REM PT RTRN 9FT ADLT (ELECTROSURGICAL) ×1 IMPLANT
GAUZE SPONGE 4X4 12PLY STRL (GAUZE/BANDAGES/DRESSINGS) ×2 IMPLANT
GAUZE SPONGE 4X4 16PLY XRAY LF (GAUZE/BANDAGES/DRESSINGS) ×2 IMPLANT
GLOVE SURG ORTHO 8.0 STRL STRW (GLOVE) ×2 IMPLANT
GOWN STRL REUS W/TWL XL LVL3 (GOWN DISPOSABLE) ×4 IMPLANT
KIT BASIN OR (CUSTOM PROCEDURE TRAY) ×2 IMPLANT
LIQUID BAND (GAUZE/BANDAGES/DRESSINGS) ×2 IMPLANT
PACK BASIC VI WITH GOWN DISP (CUSTOM PROCEDURE TRAY) ×2 IMPLANT
SHEARS HARMONIC 9CM CVD (BLADE) ×2 IMPLANT
STAPLER VISISTAT 35W (STAPLE) IMPLANT
STRIP CLOSURE SKIN 1/2X4 (GAUZE/BANDAGES/DRESSINGS) ×2 IMPLANT
SUT MNCRL AB 4-0 PS2 18 (SUTURE) ×2 IMPLANT
SUT SILK 2 0 (SUTURE)
SUT SILK 2-0 18XBRD TIE 12 (SUTURE) IMPLANT
SUT SILK 3 0 (SUTURE)
SUT SILK 3-0 18XBRD TIE 12 (SUTURE) IMPLANT
SUT VIC AB 3-0 SH 18 (SUTURE) ×4 IMPLANT
SYR BULB IRRIGATION 50ML (SYRINGE) ×2 IMPLANT
TAPE CLOTH SURG 4X10 WHT LF (GAUZE/BANDAGES/DRESSINGS) ×2 IMPLANT
TOWEL OR 17X26 10 PK STRL BLUE (TOWEL DISPOSABLE) ×2 IMPLANT
TOWEL OR NON WOVEN STRL DISP B (DISPOSABLE) ×2 IMPLANT
YANKAUER SUCT BULB TIP 10FT TU (MISCELLANEOUS) ×2 IMPLANT

## 2015-06-13 NOTE — Anesthesia Postprocedure Evaluation (Signed)
  Anesthesia Post-op Note  Patient: Mary Key  Procedure(s) Performed: Procedure(s): TOTAL THYROIDECTOMY (N/A)  Patient Location: PACU  Anesthesia Type:General  Level of Consciousness: awake, alert  and oriented  Airway and Oxygen Therapy: Patient Spontanous Breathing  Post-op Pain: mild  Post-op Assessment: Post-op Vital signs reviewed and Patient's Cardiovascular Status Stable              Post-op Vital Signs: Reviewed and stable  Last Vitals:  Filed Vitals:   06/13/15 1038  BP: 117/71  Pulse: 64  Temp: 36.7 C  Resp: 16    Complications: No apparent anesthesia complications

## 2015-06-13 NOTE — Interval H&P Note (Signed)
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  The various methods of treatment have been discussed with the patient and family. After consideration of risks, benefits and other options for treatment, the patient has consented to    Procedure(s): TOTAL THYROIDECTOMY WITH SENTINEL LYMPH NODE DISSECTION (N/A) as a surgical intervention .    The patient's history has been reviewed, patient examined, no change in status, stable for surgery.  I have reviewed the patient's chart and labs.  Questions were answered to the patient's satisfaction.    Earnstine Regal, MD, [facility] Office: [place]

## 2015-06-13 NOTE — Transfer of Care (Signed)
Immediate Anesthesia Transfer of Care Note  Patient: Mary Key  Procedure(s) Performed: Procedure(s): TOTAL THYROIDECTOMY (N/A)  Patient Location: PACU  Anesthesia Type:General  Level of Consciousness: awake, alert , oriented and patient cooperative  Airway & Oxygen Therapy: Patient Spontanous Breathing and Patient connected to face mask oxygen  Post-op Assessment: Report given to RN, Post -op Vital signs reviewed and stable and Patient moving all extremities X 4  Post vital signs: stable  Last Vitals:  Filed Vitals:   06/13/15 0550  BP: 103/70  Pulse: 62  Temp: 36.5 C  Resp: 16    Complications: No apparent anesthesia complications

## 2015-06-13 NOTE — Op Note (Addendum)
Procedure Note  Pre-operative Diagnosis:  Papillary thyroid carcinoma  Post-operative Diagnosis:  same  Surgeon:  Earnstine Regal, MD, FACS  Assistant:  none   Procedure:  Total thyroidectomy with limited central compartment lymph node dissection  Anesthesia:  General  Estimated Blood Loss:  minimal  Drains: none         Specimen: thyroid to pathology  Indications:  The patient is a 59 year old female who presents with thyroid cancer. Patient is referred by Dr. Renato Shin for newly diagnosed papillary thyroid carcinoma. Patient had noted on self-examination approximately 6 weeks ago a nodule in the anterior right neck. She demonstrated this to her gynecologist and was sent for a thyroid ultrasound. Ultrasound was performed on April 18, 2015 and showed a slightly enlarged thyroid gland with a 2.4 cm nodule in the right thyroid lobe and a 1.6 cm nodule in the left thyroid lobe. Subsequent fine-needle aspiration of the right thyroid nodule was performed. 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 Patient has no prior history of thyroid disease.  Procedure Details: Procedure was done in OR #4 at the [facility].  The patient was brought to the operating room and placed in a supine position on the operating room table.  Following administration of general anesthesia, the patient was positioned and then prepped and draped in the usual aseptic fashion.  After ascertaining that an adequate level of anesthesia had been achieved, a Kocher incision was made with #15 blade.  Dissection was carried through subcutaneous tissues and platysma. Hemostasis was achieved with the electrocautery.  Skin flaps were elevated cephalad and caudad from the thyroid notch to the sternal notch.  The Mahorner self-retaining retractor was placed for exposure.  Strap muscles were incised in the midline and dissection was begun on the left side.   Strap muscles were reflected laterally.  Left thyroid lobe was normal in size without obvious nodules.  The left lobe was gently mobilized with blunt dissection.  Superior pole vessels were dissected out and divided individually between small and medium Ligaclips with the Harmonic scalpel.  The thyroid lobe was rolled anteriorly.  Branches of the inferior thyroid artery were divided between small Ligaclips with the Harmonic scalpel.  Inferior venous tributaries were divided between Ligaclips.  Both the superior and inferior parathyroid glands were identified and preserved on their vascular pedicles.  The recurrent laryngeal nerve was identified and preserved along its course.  The ligament of Gwenlyn Found was released with the electrocautery and the gland was mobilized onto the anterior trachea. Isthmus was mobilized across the midline.  There was a small pyramidal lobe present which was mobilized and resected with the isthmus.  Dry pack was placed in the left neck.  Next, the right thyroid lobe was gently mobilized with blunt dissection.  Right thyroid lobe was larger with a dominant mass in the inferior pole measuring approximately 2cm in size.  It was somewhat adherent to the overlying strap muscles which were partially resected with the lobe.  Superior pole vessels were dissected out and divided between small and medium Ligaclips with the Harmonic scalpel.  Superior parathyroid was identified and preserved.  Inferior venous tributaries were divided between medium Ligaclips with the Harmonic scalpel.  The right thyroid lobe was rolled anteriorly and the branches of the inferior thyroid artery divided between small Ligaclips.  The right recurrent laryngeal nerve was identified and preserved along its course.  The ligament of Gwenlyn Found was released with  the electrocautery.  The right thyroid lobe was mobilized onto the anterior trachea and the remainder of the thyroid was dissected off the anterior trachea and the thyroid  was completely excised.  A suture was used to mark the right lobe. The entire thyroid gland was submitted to pathology for review.  Central compartment lymph node tissue was dissected out from the right carotid artery to the left carotid artery and inferiorly to the thymus.  Vessels were divided with the Harmonic scalpel between ligaclips.  Tissue was submitted separately to pathology for review.  The neck was irrigated with warm saline.  Fibrillar was placed throughout the operative field.  Strap muscles were reapproximated in the midline with interrupted 3-0 Vicryl sutures.  Platysma was closed with interrupted 3-0 Vicryl sutures.  Skin was closed with a running 4-0 Monocryl subcuticular suture.  Wound was washed and dried and benzoin and steri-strips were applied.  Dry gauze dressing was placed.  The patient was awakened from anesthesia and brought to the recovery room.  The patient tolerated the procedure well.   Earnstine Regal, MD, [facility] Office: [phone]

## 2015-06-13 NOTE — Anesthesia Procedure Notes (Signed)
Procedure Name: Intubation Date/Time: 06/13/2015 7:30 AM Performed by: Lissa Morales Pre-anesthesia Checklist: Patient identified, Emergency Drugs available, Suction available and Patient being monitored Patient Re-evaluated:Patient Re-evaluated prior to inductionOxygen Delivery Method: Circle System Utilized Preoxygenation: Pre-oxygenation with 100% oxygen Intubation Type: IV induction Ventilation: Mask ventilation without difficulty Laryngoscope Size: Mac and 3 Grade View: Grade II Tube type: Oral Tube size: 7.0 mm Number of attempts: 1 Airway Equipment and Method: Stylet and Oral airway Placement Confirmation: ETT inserted through vocal cords under direct vision,  positive ETCO2 and breath sounds checked- equal and bilateral Secured at: 21 cm Tube secured with: Tape Dental Injury: Teeth and Oropharynx as per pre-operative assessment

## 2015-06-14 DIAGNOSIS — C73 Malignant neoplasm of thyroid gland: Secondary | ICD-10-CM | POA: Diagnosis not present

## 2015-06-14 LAB — BASIC METABOLIC PANEL
ANION GAP: 6 (ref 5–15)
BUN: 10 mg/dL (ref 6–20)
CHLORIDE: 101 mmol/L (ref 101–111)
CO2: 29 mmol/L (ref 22–32)
Calcium: 8.5 mg/dL — ABNORMAL LOW (ref 8.9–10.3)
Creatinine, Ser: 0.62 mg/dL (ref 0.44–1.00)
GFR calc Af Amer: >60 mL/min (ref >60)
GLUCOSE: 140 mg/dL — AB (ref 65–99)
POTASSIUM: 4.2 mmol/L (ref 3.5–5.1)
Sodium: 136 mmol/L (ref 135–145)

## 2015-06-14 MED ORDER — OXYCODONE HCL 5 MG PO TABS: 5.0000 mg | ORAL_TABLET | ORAL | Status: DC | PRN

## 2015-06-14 MED ORDER — CALCIUM CARBONATE 1250 (500 CA) MG PO TABS: 2.0000 | ORAL_TABLET | Freq: Three times a day (TID) | ORAL | Status: DC

## 2015-06-14 MED ORDER — SYNTHROID 100 MCG PO TABS: 100.0000 ug | ORAL_TABLET | Freq: Every day | ORAL | Status: DC

## 2015-06-14 NOTE — Discharge Summary (Signed)
Physician Discharge Summary [facility]  Patient ID: Mary Key MRN: [id number] DOB/AGE: 18-May-1956 59 y.o.  Admit date: 06/13/2015 Discharge date: 06/14/2015  Admission Diagnoses:  Papillary thyroid carcinoma  Discharge Diagnoses:  Principal Problem:   Papillary thyroid carcinoma Active Problems:   Thyroid carcinoma   Discharged Condition: good  Hospital Course: Patient was admitted for observation following thyroid surgery.  Post op course was uncomplicated.  Pain was well controlled.  Tolerated diet.  Post op calcium level on morning following surgery was 8.5 mg/dl.  Patient was prepared for discharge home on POD#1.  Consults: None  Treatments: surgery: total thyroidectomy with limited lymph node dissection  Discharge Exam: Blood pressure 115/62, pulse 59, temperature 97.9 F (36.6 C), temperature source Oral, resp. rate 17, height 5\' 6"  (1.676 m), weight 65.998 kg (145 lb 8 oz), SpO2 98 %. HEENT - clear Neck - wound dry and intact; voice normal Chest - clear bilaterally Cor - RRR  Disposition: Home  Discharge Instructions    Apply dressing    Complete by:  As directed   Apply light gauze dressing to wound before discharge home today.     Diet - low sodium heart healthy    Complete by:  As directed      Discharge instructions    Complete by:  As directed   [facility]  THYROID & PARATHYROID SURGERY:  POST-OP INSTRUCTIONS  Always review your discharge instruction sheet from the facility where your surgery was performed.  A prescription for pain medication may be given to you upon discharge.  Take your pain medication as prescribed.  If narcotic pain medicine is not needed, then you may take acetaminophen (Tylenol) or ibuprofen (Advil) as needed.  Take your usually prescribed medications unless otherwise directed.  If you need a refill on your pain medication, please contact your pharmacy. They will contact our office to  request authorization.  Prescriptions will not be processed by our office after 5 pm or on weekends.  Start with a light diet upon arrival home, such as soup and crackers or toast.  Be sure to drink plenty of fluids daily.  Resume your normal diet the day after surgery.  Most patients will experience some swelling and bruising on the chest and neck area.  Ice packs will help.  Swelling and bruising can take several days to resolve.   It is common to experience some constipation after surgery.  Increasing fluid intake and taking a stool softener will usually help or prevent this problem.  A mild laxative (Milk of Magnesia or Miralax) should be taken according to package directions if there has been no bowel movement after 48 hours.  You have steri-strips and a gauze dressing over your incision.  You may remove the gauze bandage on the second day after surgery, and you may shower at that time.  Leave your steri-strips (small skin tapes) in place directly over the incision.  These strips should remain on the skin for 5-7 days and then be removed.  You may get them wet in the shower and pat them dry.  You may resume regular (light) daily activities beginning the next day - such as daily self-care, walking, climbing stairs - gradually increasing activities as tolerated.  You may have sexual intercourse when it is comfortable.  Refrain from any heavy lifting or straining until approved by your doctor.  You may drive when you no longer are taking prescription pain medication, you can comfortably wear  a seatbelt, and you can safely maneuver your car and apply brakes.  You should see your doctor in the office for a follow-up appointment approximately two to three weeks after your surgery.  Make sure that you call for this appointment within a day or two after you arrive home to insure a convenient appointment time.  WHEN TO CALL YOUR DOCTOR: -- Fever greater than 101.5 -- Inability to urinate -- Nausea  and/or vomiting - persistent -- Extreme swelling or bruising -- Continued bleeding from incision -- Increased pain, redness, or drainage from the incision -- Difficulty swallowing or breathing -- Muscle cramping or spasms -- Numbness or tingling in hands or around lips  The clinic staff is available to answer your questions during regular business hours.  Please don't hesitate to call and ask to speak to one of the nurses if you have concerns.  Earnstine Regal, MD, [facility] Office: [phone]  Website: [email or web address]     Increase activity slowly    Complete by:  As directed      Remove dressing in 24 hours    Complete by:  As directed             Medication List    TAKE these medications        amphetamine-dextroamphetamine 10 MG tablet  Commonly known as:  ADDERALL  Take 5-10 mg by mouth 2 (two) times daily. 10 mg in the morning 5 mg midday     buPROPion 150 MG 12 hr tablet  Commonly known as:  ZYBAN  Take 150 mg by mouth every morning.     busPIRone 15 MG tablet  Commonly known as:  BUSPAR  Take 7.5-15 mg by mouth 2 (two) times daily. 15 mg ins the morning and 7.5 mg midday     CALCIUM + D + K PO  Take 1 tablet by mouth 3 (three) times daily.     calcium carbonate 1250 (500 CA) MG tablet  Commonly known as:  OS-CAL - dosed in mg of elemental calcium  Take 2 tablets (1,000 mg of elemental calcium total) by mouth 3 (three) times daily with meals.     cholecalciferol 1000 UNITS tablet  Commonly known as:  VITAMIN D  Take 2,000 Units by mouth every morning.     EPIPEN 2-PAK 0.3 mg/0.3 mL Soaj injection  Generic drug:  EPINEPHrine  inject as directed for LIFE-THREATENING ALLERGIC REACTION     ESTRACE VAGINAL 0.1 MG/GM vaginal cream  Generic drug:  estradiol  Place 1 Applicatorful vaginally 2 (two) times a week.     FLUoxetine 10 MG tablet  Commonly known as:  PROZAC  Take 10 mg by mouth  every morning.     GENTEAL OP  Apply 1 drop to eye 2 (two) times daily.     GLUCOSAMINE 1500 COMPLEX PO  Take 1 tablet by mouth every morning.     ibuprofen 200 MG tablet  Commonly known as:  ADVIL,MOTRIN  Take 200 mg by mouth every 6 (six) hours as needed for moderate pain.     oxyCODONE 5 MG immediate release tablet  Commonly known as:  Oxy IR/ROXICODONE  Take 1-2 tablets (5-10 mg total) by mouth every 4 (four) hours as needed for moderate pain.     RHINOCORT ALLERGY 32 MCG/ACT nasal spray  Generic drug:  budesonide  Place 1 spray into both nostrils every morning.     SYNTHROID 100 MCG tablet  Generic drug:  levothyroxine  Take 1 tablet (100 mcg total) by mouth daily.     tretinoin 0.025 % cream  Commonly known as:  RETIN-A  Apply 1 application topically 2 (two) times a week.     triamcinolone cream 0.1 %  Commonly known as:  KENALOG  Apply 1 application topically 3 (three) times a week. As needed for eczema.     zolpidem 5 MG tablet  Commonly known as:  AMBIEN  Take 5 mg by mouth at bedtime as needed for sleep.         Earnstine Regal, MD, [facility] Office: [phone]   Signed: Earnstine Regal 06/14/2015, 8:50 AM

## 2015-06-17 ENCOUNTER — Encounter: Payer: Self-pay | Admitting: Endocrinology

## 2015-06-18 ENCOUNTER — Telehealth: Payer: Self-pay | Admitting: Endocrinology

## 2015-06-18 NOTE — Telephone Encounter (Signed)
please call patient: i heard about the surgery results. For now, please stop taking the thyroid pill. Would you like to move up the date of your appointment here? Any day is fine with me.

## 2015-06-18 NOTE — Telephone Encounter (Signed)
Left voicemail advising of note below. Requested a call back from the patient if she would like to move up her appointment.

## 2015-06-24 ENCOUNTER — Encounter: Payer: Self-pay | Admitting: Endocrinology

## 2015-06-28 ENCOUNTER — Ambulatory Visit (INDEPENDENT_AMBULATORY_CARE_PROVIDER_SITE_OTHER): Payer: 59 | Admitting: Endocrinology

## 2015-06-28 ENCOUNTER — Encounter: Payer: Self-pay | Admitting: Endocrinology

## 2015-06-28 DIAGNOSIS — E890 Postprocedural hypothyroidism: Secondary | ICD-10-CM | POA: Diagnosis not present

## 2015-06-28 MED ORDER — LIOTHYRONINE SODIUM 25 MCG PO TABS: 12.5000 ug | ORAL_TABLET | Freq: Two times a day (BID) | ORAL | Status: DC

## 2015-06-28 NOTE — Patient Instructions (Addendum)
Please change the thyroid medication.  I have sent a prescription to your pharmacy Please redo the blood tests in 10 days.   We'll be able to do the radioactive iodine soon thereafter if you want.

## 2015-06-28 NOTE — Progress Notes (Signed)
Subjective:    Patient ID: Mary Key, female    DOB: 11-Aug-1956, 59 y.o.   MRN: [id number]  HPI Pt recently had thyroidectomy for papillary adenocarcinoma of the thyroid.  pathol was T2 N1a.  She is on synthroid.  pt states she feels well in general, except for fatigue.   Past Medical History  Diagnosis Date  . Arthritis   . GERD (gastroesophageal reflux disease)     eats carefully   . Hx of hepatitis C     cured with medication  . Thyroid cancer ([place])   . Difficulty sleeping     takes ambien PRN  . Depression   . Anxiety   . ADD (attention deficit disorder)     Past Surgical History  Procedure Laterality Date  . Tonsillectomy    . Thyroidectomy N/A 06/13/2015    Procedure: TOTAL THYROIDECTOMY;  Surgeon: Armandina Gemma, MD;  Location: [facility];  Service: General;  Laterality: N/A;    Social History   Social History  . Marital Status: Married    Spouse Name: N/A  . Number of Children: N/A  . Years of Education: N/A   Occupational History  . Not on file.   Social History Main Topics  . Smoking status: Never Smoker   . Smokeless tobacco: Not on file  . Alcohol Use: 0.0 oz/week    0 Standard drinks or equivalent per week     Comment: 3-4 beers per week   . Drug Use: No  . Sexual Activity: Not on file   Other Topics Concern  . Not on file   Social History Narrative    Current Outpatient Prescriptions on File Prior to Visit  Medication Sig Dispense Refill  . amphetamine-dextroamphetamine (ADDERALL) 10 MG tablet Take 5-10 mg by mouth 2 (two) times daily. 10 mg in the morning 5 mg midday    . budesonide (RHINOCORT ALLERGY) 32 MCG/ACT nasal spray Place 1 spray into both nostrils every morning.     Marland Kitchen buPROPion (ZYBAN) 150 MG 12 hr tablet Take 150 mg by mouth every morning.     . busPIRone (BUSPAR) 15 MG tablet Take 7.5-15 mg by mouth 2 (two) times daily. 15 mg ins the morning and 7.5 mg midday    . calcium carbonate (OS-CAL - DOSED IN MG OF ELEMENTAL CALCIUM) 1250 (500  CA) MG tablet Take 2 tablets (1,000 mg of elemental calcium total) by mouth 3 (three) times daily with meals. 60 tablet 1  . Calcium-Vitamin D-Vitamin K (CALCIUM + D + K PO) Take 1 tablet by mouth 3 (three) times daily.     . Carboxymethylcell-Hypromellose (GENTEAL OP) Apply 1 drop to eye 2 (two) times daily.    . cholecalciferol (VITAMIN D) 1000 UNITS tablet Take 2,000 Units by mouth every morning.     Marland Kitchen EPIPEN 2-PAK 0.3 MG/0.3ML SOAJ injection inject as directed for LIFE-THREATENING ALLERGIC REACTION  0  . estradiol (ESTRACE VAGINAL) 0.1 MG/GM vaginal cream Place 1 Applicatorful vaginally 2 (two) times a week.     Marland Kitchen FLUoxetine (PROZAC) 10 MG tablet Take 10 mg by mouth every morning.     Marland Kitchen ibuprofen (ADVIL,MOTRIN) 200 MG tablet Take 200 mg by mouth every 6 (six) hours as needed for moderate pain.     Marland Kitchen tretinoin (RETIN-A) 0.025 % cream Apply 1 application topically 2 (two) times a week.    . triamcinolone cream (KENALOG) 0.1 % Apply 1 application topically 3 (three) times a week. As needed for eczema.    Marland Kitchen  zolpidem (AMBIEN) 5 MG tablet Take 5 mg by mouth at bedtime as needed for sleep.    . Glucosamine-Chondroit-Vit C-Mn (GLUCOSAMINE 1500 COMPLEX PO) Take 1 tablet by mouth every morning.      No current facility-administered medications on file prior to visit.    Allergies  Allergen Reactions  . Bee Venom     Other reaction(s): ANAPHYLAXIS  . Peanut-Containing Drug Products     Other reaction(s): ANAPHYLAXIS- almonds, pecans.   Marland Kitchen Singulair [Montelukast Sodium] Other (See Comments)    anxiety    Family History  Problem Relation Age of Onset  . Thyroid disease Mother     BP 120/78 mmHg  Pulse 62  Temp(Src) 98.1 F (36.7 C) (Oral)  Ht 5\' 6"  (1.676 m)  Wt 141 lb (63.957 kg)  BMI 22.77 kg/m2  SpO2 96%  Review of Systems No weight change.      Objective:   Physical Exam VITAL SIGNS:  See vs page GENERAL: no distress PSYCH: Alert and well-oriented.  Does not appear anxious  nor depressed.    (i reviewed pathol report with pt and her husband).      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  45 minute ov, of which 35 minutes is counseling on the rx of differentiated thyroid cancer.   Postsurgical hypothyroidism: we discussed short-term options.  She wants to change to cytomel for now, and do f/u labs soon.    Patient is advised the following: Patient Instructions  Please change the thyroid medication.  I have sent a prescription to your pharmacy Please redo the blood tests in 10 days.   We'll be able to do the radioactive iodine soon thereafter if you want.

## 2015-07-05 ENCOUNTER — Other Ambulatory Visit (INDEPENDENT_AMBULATORY_CARE_PROVIDER_SITE_OTHER): Payer: 59

## 2015-07-05 DIAGNOSIS — E890 Postprocedural hypothyroidism: Secondary | ICD-10-CM | POA: Diagnosis not present

## 2015-07-05 LAB — BASIC METABOLIC PANEL
BUN: 23 mg/dL (ref 6–23)
CO2: 29 meq/L (ref 19–32)
Calcium: 9.8 mg/dL (ref 8.4–10.5)
Chloride: 103 mEq/L (ref 96–112)
Creatinine, Ser: 0.92 mg/dL (ref 0.40–1.20)
GFR: 65.96 mL/min (ref >60.00)
GLUCOSE: 85 mg/dL (ref 70–99)
POTASSIUM: 4 meq/L (ref 3.5–5.1)
Sodium: 140 mEq/L (ref 135–145)

## 2015-07-05 LAB — PHOSPHORUS: Phosphorus: 3.2 mg/dL (ref 2.3–4.6)

## 2015-07-05 LAB — MAGNESIUM: Magnesium: 2.2 mg/dL (ref 1.5–2.5)

## 2015-07-05 LAB — TSH: TSH: 2.46 u[IU]/mL (ref 0.35–4.50)

## 2015-07-08 ENCOUNTER — Encounter: Payer: Self-pay | Admitting: Endocrinology

## 2015-07-08 LAB — PTH, INTACT AND CALCIUM
Calcium: 9.6 mg/dL (ref 8.4–10.5)
PTH: 35 pg/mL (ref 14–64)

## 2015-07-10 ENCOUNTER — Ambulatory Visit (INDEPENDENT_AMBULATORY_CARE_PROVIDER_SITE_OTHER): Payer: 59 | Admitting: Endocrinology

## 2015-07-10 ENCOUNTER — Encounter: Payer: Self-pay | Admitting: Endocrinology

## 2015-07-10 VITALS — BP 122/80 | HR 60 | Temp 97.9°F | Ht 66.0 in | Wt 140.0 lb

## 2015-07-10 DIAGNOSIS — E890 Postprocedural hypothyroidism: Secondary | ICD-10-CM | POA: Diagnosis not present

## 2015-07-10 LAB — TSH: TSH: 8.92 u[IU]/mL — AB (ref 0.35–4.50)

## 2015-07-10 NOTE — Progress Notes (Signed)
Subjective:    Patient ID: Mary Key, female    DOB: 17-Oct-1955, 59 y.o.   MRN: [id number]  HPI Pt recently had thyroidectomy for papillary adenocarcinoma of the thyroid.  pathol was T2 N1a.  She is on synthroid.  pt states she feels well in general, except for fatigue.  Past Medical History  Diagnosis Date  . Arthritis   . GERD (gastroesophageal reflux disease)     eats carefully   . Hx of hepatitis C     cured with medication  . Thyroid cancer ([place])   . Difficulty sleeping     takes ambien PRN  . Depression   . Anxiety   . ADD (attention deficit disorder)     Past Surgical History  Procedure Laterality Date  . Tonsillectomy    . Thyroidectomy N/A 06/13/2015    Procedure: TOTAL THYROIDECTOMY;  Surgeon: Armandina Gemma, MD;  Location: [facility];  Service: General;  Laterality: N/A;    Social History   Social History  . Marital Status: Married    Spouse Name: N/A  . Number of Children: N/A  . Years of Education: N/A   Occupational History  . Not on file.   Social History Main Topics  . Smoking status: Never Smoker   . Smokeless tobacco: Not on file  . Alcohol Use: 0.0 oz/week    0 Standard drinks or equivalent per week     Comment: 3-4 beers per week   . Drug Use: No  . Sexual Activity: Not on file   Other Topics Concern  . Not on file   Social History Narrative    Current Outpatient Prescriptions on File Prior to Visit  Medication Sig Dispense Refill  . amphetamine-dextroamphetamine (ADDERALL) 10 MG tablet Take 5-10 mg by mouth 2 (two) times daily. 10 mg in the morning 5 mg midday    . budesonide (RHINOCORT ALLERGY) 32 MCG/ACT nasal spray Place 1 spray into both nostrils every morning.     Marland Kitchen buPROPion (ZYBAN) 150 MG 12 hr tablet Take 150 mg by mouth every morning.     . busPIRone (BUSPAR) 15 MG tablet Take 7.5-15 mg by mouth 2 (two) times daily. 15 mg ins the morning and 7.5 mg midday    . calcium carbonate (OS-CAL - DOSED IN MG OF ELEMENTAL CALCIUM) 1250 (500  CA) MG tablet Take 2 tablets (1,000 mg of elemental calcium total) by mouth 3 (three) times daily with meals. 60 tablet 1  . Calcium-Vitamin D-Vitamin K (CALCIUM + D + K PO) Take 1 tablet by mouth 3 (three) times daily.     . Carboxymethylcell-Hypromellose (GENTEAL OP) Apply 1 drop to eye 2 (two) times daily.    . cholecalciferol (VITAMIN D) 1000 UNITS tablet Take 2,000 Units by mouth every morning.     Marland Kitchen EPIPEN 2-PAK 0.3 MG/0.3ML SOAJ injection inject as directed for LIFE-THREATENING ALLERGIC REACTION  0  . estradiol (ESTRACE VAGINAL) 0.1 MG/GM vaginal cream Place 1 Applicatorful vaginally 2 (two) times a week.     Marland Kitchen FLUoxetine (PROZAC) 10 MG tablet Take 10 mg by mouth every morning.     . Glucosamine-Chondroit-Vit C-Mn (GLUCOSAMINE 1500 COMPLEX PO) Take 1 tablet by mouth every morning.     Marland Kitchen ibuprofen (ADVIL,MOTRIN) 200 MG tablet Take 200 mg by mouth every 6 (six) hours as needed for moderate pain.     Marland Kitchen tretinoin (RETIN-A) 0.025 % cream Apply 1 application topically 2 (two) times a week.    . triamcinolone cream (  KENALOG) 0.1 % Apply 1 application topically 3 (three) times a week. As needed for eczema.    Marland Kitchen zolpidem (AMBIEN) 5 MG tablet Take 5 mg by mouth at bedtime as needed for sleep.     No current facility-administered medications on file prior to visit.    Allergies  Allergen Reactions  . Bee Venom     Other reaction(s): ANAPHYLAXIS  . Peanut-Containing Drug Products     Other reaction(s): ANAPHYLAXIS- almonds, pecans.   Marland Kitchen Singulair [Montelukast Sodium] Other (See Comments)    anxiety    Family History  Problem Relation Age of Onset  . Thyroid disease Mother     BP 122/80 mmHg  Pulse 60  Temp(Src) 97.9 F (36.6 C) (Oral)  Ht 5\' 6"  (1.676 m)  Wt 140 lb (63.504 kg)  BMI 22.61 kg/m2  SpO2 94%  Review of Systems No weight change.      Objective:   Physical Exam VITAL SIGNS:  See vs page GENERAL: no distress PSYCH: Alert and well-oriented.  Does not appear anxious  nor depressed.  Lab Results  Component Value Date   TSH 8.92* 07/10/2015      Assessment & Plan:  Postsurgical hypothyroidism, on thyroid hormone withdrawal, in preparation for adjuvant I-131 rx.    Patient is advised the following: Patient Instructions  A thyroid blood test requested for you today.  We'll let you know about the results.  We'll be able to do the radioactive iodine soon thereafter.     addendum: stay off cytomel.  Recheck TSH next week.

## 2015-07-10 NOTE — Patient Instructions (Addendum)
A thyroid blood test requested for you today.  We'll let you know about the results.  We'll be able to do the radioactive iodine soon thereafter.

## 2015-07-12 ENCOUNTER — Encounter: Payer: Self-pay | Admitting: Endocrinology

## 2015-07-15 ENCOUNTER — Other Ambulatory Visit: Payer: Self-pay | Admitting: Endocrinology

## 2015-07-15 ENCOUNTER — Other Ambulatory Visit (INDEPENDENT_AMBULATORY_CARE_PROVIDER_SITE_OTHER): Payer: 59

## 2015-07-15 DIAGNOSIS — E890 Postprocedural hypothyroidism: Secondary | ICD-10-CM

## 2015-07-15 LAB — TSH: TSH: 18.16 u[IU]/mL — ABNORMAL HIGH (ref 0.35–4.50)

## 2015-07-16 ENCOUNTER — Encounter: Payer: Self-pay | Admitting: Endocrinology

## 2015-07-18 ENCOUNTER — Other Ambulatory Visit (INDEPENDENT_AMBULATORY_CARE_PROVIDER_SITE_OTHER): Payer: 59

## 2015-07-18 DIAGNOSIS — E890 Postprocedural hypothyroidism: Secondary | ICD-10-CM | POA: Diagnosis not present

## 2015-07-18 LAB — TSH: TSH: 63.42 u[IU]/mL — ABNORMAL HIGH (ref 0.35–4.50)

## 2015-07-19 ENCOUNTER — Other Ambulatory Visit: Payer: Self-pay | Admitting: Endocrinology

## 2015-07-19 ENCOUNTER — Encounter: Payer: Self-pay | Admitting: Endocrinology

## 2015-07-19 DIAGNOSIS — C73 Malignant neoplasm of thyroid gland: Secondary | ICD-10-CM

## 2015-07-28 ENCOUNTER — Encounter: Payer: Self-pay | Admitting: Endocrinology

## 2015-07-29 ENCOUNTER — Encounter: Payer: Self-pay | Admitting: Endocrinology

## 2015-07-30 ENCOUNTER — Ambulatory Visit (HOSPITAL_COMMUNITY)
Admission: RE | Admit: 2015-07-30 | Discharge: 2015-07-30 | Disposition: A | Discharge status: Home / Self Care | Payer: 59 | Source: Ambulatory Visit | Attending: Endocrinology | Admitting: Endocrinology

## 2015-07-30 DIAGNOSIS — C73 Malignant neoplasm of thyroid gland: Secondary | ICD-10-CM | POA: Diagnosis not present

## 2015-07-30 DIAGNOSIS — E890 Postprocedural hypothyroidism: Secondary | ICD-10-CM | POA: Diagnosis not present

## 2015-07-30 DIAGNOSIS — C779 Secondary and unspecified malignant neoplasm of lymph node, unspecified: Secondary | ICD-10-CM | POA: Insufficient documentation

## 2015-07-30 MED ORDER — SODIUM IODIDE I 131 CAPSULE: 136.8000 | Freq: Once | INTRAVENOUS | Status: DC | PRN

## 2015-07-31 ENCOUNTER — Encounter: Payer: Self-pay | Admitting: Endocrinology

## 2015-08-05 NOTE — Telephone Encounter (Signed)
Patient Name: Mary Key Gender: Female DOB: 1956/04/02 Age: 59 Y 70 M Return Phone Number: [phone] (Primary), [phone] (Secondary) Address: City/State/Zip: [place] Client [facility] Night - Client Client Site Agency [facility] Physician Renato Shin Contact Type Call Call Type Triage / Clinical Caller Name Chrissie Noa Relationship To Patient Spouse Return Phone Number [phone] (Primary) Chief Complaint Nausea Initial Comment Caller states wife is being treated with radioactive iodine, now nauseated. PreDisposition Home Care Nurse Assessment Nurse: Cox, RN, [place] Date/Time (Eastern Time): 08/04/2015 9:15:26 AM Confirm and document reason for call. If symptomatic, describe symptoms. ---Caller states wife is being treated with radioactive iodine, feeling nauseated for last 3 days. Had thyroid removed 6 weeks ago, having ablation. Headache. Swelling in throat. Pain where receiving radiation.

## 2015-08-05 NOTE — Telephone Encounter (Signed)
See note below and please advise, Thanks! 

## 2015-08-09 ENCOUNTER — Other Ambulatory Visit: Payer: Self-pay | Admitting: Endocrinology

## 2015-08-09 ENCOUNTER — Encounter: Payer: Self-pay | Admitting: Endocrinology

## 2015-08-09 ENCOUNTER — Encounter (HOSPITAL_COMMUNITY)
Admission: RE | Admit: 2015-08-09 | Discharge: 2015-08-09 | Disposition: A | Discharge status: Home / Self Care | Payer: 59 | Source: Ambulatory Visit | Attending: Endocrinology | Admitting: Endocrinology

## 2015-08-09 DIAGNOSIS — C73 Malignant neoplasm of thyroid gland: Secondary | ICD-10-CM | POA: Insufficient documentation

## 2015-08-12 ENCOUNTER — Telehealth: Payer: Self-pay | Admitting: Endocrinology

## 2015-08-12 NOTE — Telephone Encounter (Signed)
Patient called stating that she had sent Dr. Loanne Drilling a message through [facility] and would like to speak to him directly regarding her results before she continues and further action   Please advise patient   Thank you

## 2015-08-12 NOTE — Telephone Encounter (Signed)
i called pt today.  i left message on VM, asking she message me on mychart

## 2015-08-12 NOTE — Telephone Encounter (Signed)
See note below to be advised. 

## 2015-08-14 ENCOUNTER — Encounter: Payer: Self-pay | Admitting: Endocrinology

## 2015-08-14 ENCOUNTER — Ambulatory Visit (INDEPENDENT_AMBULATORY_CARE_PROVIDER_SITE_OTHER): Payer: 59 | Admitting: Endocrinology

## 2015-08-14 VITALS — BP 124/82 | HR 74 | Temp 98.4°F | Ht 66.0 in

## 2015-08-14 DIAGNOSIS — E890 Postprocedural hypothyroidism: Secondary | ICD-10-CM | POA: Diagnosis not present

## 2015-08-14 NOTE — Patient Instructions (Addendum)
If the MRI does not show any cancer, our tentative plan will be to recheck the nuclear medicine scan (and the cancer detection blood test), in approx 6 months. Please redo the thyroid blood level late next month.

## 2015-08-14 NOTE — Progress Notes (Signed)
Subjective:    Patient ID: Mary Key, female    DOB: May 06, 1956, 59 y.o.   MRN: [id number]  HPI Pt returns for f/u of papillary adenocarcinoma of the thyroid.   9/16: thyroidectomy: pathol showed right lobe PAPILLARY THYROID CARCINOMA, CLASSIC TYPE, 2.2 CM.  NO EXTRATHYROIDAL EXTENSION. RESECTION MARGINS ARE NEGATIVE. ONE OF ONE LYMPH NODES NEGATIVE FOR CARCINOMA (0/1).  (T2 N1a).   11/16: with thyroid hormone withdrawn, I-131 rx 137 [facility]. 11/16: post-therapy scan: pos at neck and ? of right pelvis Postsurgical hypothyroidism: she is on synthroid.  pt states she feels well in general, except for fatigue. Past Medical History  Diagnosis Date  . Arthritis   . GERD (gastroesophageal reflux disease)     eats carefully   . Hx of hepatitis C     cured with medication  . Thyroid cancer ([place])   . Difficulty sleeping     takes ambien PRN  . Depression   . Anxiety   . ADD (attention deficit disorder)     Past Surgical History  Procedure Laterality Date  . Tonsillectomy    . Thyroidectomy N/A 06/13/2015    Procedure: TOTAL THYROIDECTOMY;  Surgeon: Armandina Gemma, MD;  Location: [facility];  Service: General;  Laterality: N/A;    Social History   Social History  . Marital Status: Married    Spouse Name: N/A  . Number of Children: N/A  . Years of Education: N/A   Occupational History  . Not on file.   Social History Main Topics  . Smoking status: Never Smoker   . Smokeless tobacco: Not on file  . Alcohol Use: 0.0 oz/week    0 Standard drinks or equivalent per week     Comment: 3-4 beers per week   . Drug Use: No  . Sexual Activity: Not on file   Other Topics Concern  . Not on file   Social History Narrative    Current Outpatient Prescriptions on File Prior to Visit  Medication Sig Dispense Refill  . amphetamine-dextroamphetamine (ADDERALL) 10 MG tablet Take 5-10 mg by mouth 2 (two) times daily. 10 mg in the morning 5 mg midday    . budesonide (RHINOCORT ALLERGY) 32 MCG/ACT  nasal spray Place 1 spray into both nostrils every morning.     Marland Kitchen buPROPion (ZYBAN) 150 MG 12 hr tablet Take 150 mg by mouth every morning.     . busPIRone (BUSPAR) 15 MG tablet Take 7.5-15 mg by mouth 2 (two) times daily. 15 mg ins the morning and 7.5 mg midday    . cholecalciferol (VITAMIN D) 1000 UNITS tablet Take 2,000 Units by mouth every morning.     Marland Kitchen EPIPEN 2-PAK 0.3 MG/0.3ML SOAJ injection inject as directed for LIFE-THREATENING ALLERGIC REACTION  0  . estradiol (ESTRACE VAGINAL) 0.1 MG/GM vaginal cream Place 1 Applicatorful vaginally 2 (two) times a week.     Marland Kitchen FLUoxetine (PROZAC) 10 MG tablet Take 10 mg by mouth every morning.     Marland Kitchen ibuprofen (ADVIL,MOTRIN) 200 MG tablet Take 200 mg by mouth every 6 (six) hours as needed for moderate pain.     Marland Kitchen tretinoin (RETIN-A) 0.025 % cream Apply 1 application topically 2 (two) times a week.    . triamcinolone cream (KENALOG) 0.1 % Apply 1 application topically 3 (three) times a week. As needed for eczema.    Marland Kitchen zolpidem (AMBIEN) 5 MG tablet Take 5 mg by mouth at bedtime as needed for sleep.     No current  facility-administered medications on file prior to visit.    Allergies  Allergen Reactions  . Bee Venom     Other reaction(s): ANAPHYLAXIS  . Peanut-Containing Drug Products     Other reaction(s): ANAPHYLAXIS- almonds, pecans.   Marland Kitchen Singulair [Montelukast Sodium] Other (See Comments)    anxiety    Family History  Problem Relation Age of Onset  . Thyroid disease Mother     BP 124/82 mmHg  Pulse 74  Temp(Src) 98.4 F (36.9 C)  Ht _0  (1.676 m)  SpO2 97%  Review of Systems Arthralgias are better    Objective:   Physical Exam VITAL SIGNS:  See vs page GENERAL: no distress Neck: a healed scar is present.  i do not appreciate a nodule in the thyroid or elsewhere in the neck     Assessment & Plan:  Postsurgical hypothyroidism: she'll soon be due for recheck of TSH on rx ? Of right pelvic met: pt says she'll do the  MRI  Patient is advised the following: Patient Instructions  If the MRI does not show any cancer, our tentative plan will be to recheck the nuclear medicine scan (and the cancer detection blood test), in approx 6 months. Please redo the thyroid blood level late next month.

## 2015-08-26 ENCOUNTER — Telehealth: Payer: Self-pay | Admitting: Endocrinology

## 2015-08-26 MED ORDER — TRIAZOLAM 0.125 MG PO TABS: 0.1250 mg | ORAL_TABLET | Freq: Once | ORAL | Status: DC

## 2015-08-26 NOTE — Telephone Encounter (Signed)
See note below and please advise, Thanks! 

## 2015-08-26 NOTE — Telephone Encounter (Signed)
Pt is beginning to become very anxious about the MRI coming up can we rx her something to help? If we are able please call into [facility]

## 2015-08-26 NOTE — Telephone Encounter (Signed)
i printed. Take just before you leave home before the MRI

## 2015-08-26 NOTE — Telephone Encounter (Signed)
I contacted the pt and advised rx has been faxed.

## 2015-08-27 ENCOUNTER — Ambulatory Visit (HOSPITAL_COMMUNITY)
Admission: RE | Admit: 2015-08-27 | Discharge: 2015-08-27 | Disposition: A | Discharge status: Home / Self Care | Payer: 59 | Source: Ambulatory Visit | Attending: Endocrinology | Admitting: Endocrinology

## 2015-08-27 ENCOUNTER — Other Ambulatory Visit: Payer: Self-pay | Admitting: Endocrinology

## 2015-08-27 ENCOUNTER — Encounter: Payer: Self-pay | Admitting: Endocrinology

## 2015-08-27 DIAGNOSIS — X58XXXA Exposure to other specified factors, initial encounter: Secondary | ICD-10-CM | POA: Diagnosis not present

## 2015-08-27 DIAGNOSIS — G9619 Other disorders of meninges, not elsewhere classified: Secondary | ICD-10-CM | POA: Diagnosis not present

## 2015-08-27 DIAGNOSIS — R1909 Other intra-abdominal and pelvic swelling, mass and lump: Secondary | ICD-10-CM | POA: Insufficient documentation

## 2015-08-27 DIAGNOSIS — C73 Malignant neoplasm of thyroid gland: Secondary | ICD-10-CM | POA: Diagnosis not present

## 2015-08-27 DIAGNOSIS — S76092A Other specified injury of muscle, fascia and tendon of left hip, initial encounter: Secondary | ICD-10-CM | POA: Insufficient documentation

## 2015-08-27 DIAGNOSIS — R1900 Intra-abdominal and pelvic swelling, mass and lump, unspecified site: Secondary | ICD-10-CM | POA: Insufficient documentation

## 2015-08-27 LAB — POCT I-STAT CREATININE: Creatinine, Ser: 0.8 mg/dL (ref 0.44–1.00)

## 2015-08-27 MED ORDER — GADOBENATE DIMEGLUMINE 529 MG/ML IV SOLN
15.0000 mL | Freq: Once | INTRAVENOUS | Status: AC | PRN
  Administered 2015-08-27: 13 mL via INTRAVENOUS

## 2015-08-29 ENCOUNTER — Other Ambulatory Visit: Payer: Self-pay | Admitting: Endocrinology

## 2015-08-29 DIAGNOSIS — R1900 Intra-abdominal and pelvic swelling, mass and lump, unspecified site: Secondary | ICD-10-CM

## 2015-09-11 ENCOUNTER — Other Ambulatory Visit (INDEPENDENT_AMBULATORY_CARE_PROVIDER_SITE_OTHER): Payer: 59

## 2015-09-11 DIAGNOSIS — E890 Postprocedural hypothyroidism: Secondary | ICD-10-CM | POA: Diagnosis not present

## 2015-09-11 LAB — TSH: TSH: 10.14 u[IU]/mL — AB (ref 0.35–4.50)

## 2015-09-12 ENCOUNTER — Other Ambulatory Visit: Payer: Self-pay | Admitting: Endocrinology

## 2015-09-12 MED ORDER — LEVOTHYROXINE SODIUM 125 MCG PO TABS: 125.0000 ug | ORAL_TABLET | Freq: Every day | ORAL | Status: DC

## 2015-10-01 ENCOUNTER — Encounter: Payer: Self-pay | Admitting: Endocrinology

## 2015-10-01 ENCOUNTER — Other Ambulatory Visit: Payer: Self-pay | Admitting: Endocrinology

## 2015-10-01 DIAGNOSIS — E890 Postprocedural hypothyroidism: Secondary | ICD-10-CM

## 2015-10-11 ENCOUNTER — Other Ambulatory Visit: Payer: Self-pay | Admitting: Endocrinology

## 2015-10-11 ENCOUNTER — Other Ambulatory Visit (INDEPENDENT_AMBULATORY_CARE_PROVIDER_SITE_OTHER): Payer: 59

## 2015-10-11 DIAGNOSIS — E890 Postprocedural hypothyroidism: Secondary | ICD-10-CM | POA: Diagnosis not present

## 2015-10-11 LAB — TSH: TSH: 7.29 u[IU]/mL — ABNORMAL HIGH (ref 0.35–4.50)

## 2015-10-11 MED ORDER — LEVOTHYROXINE SODIUM 150 MCG PO TABS: 150.0000 ug | ORAL_TABLET | Freq: Every day | ORAL | Status: DC

## 2015-11-06 ENCOUNTER — Encounter: Payer: Self-pay | Admitting: Endocrinology

## 2015-11-06 ENCOUNTER — Other Ambulatory Visit: Payer: Self-pay | Admitting: Endocrinology

## 2015-11-06 DIAGNOSIS — E890 Postprocedural hypothyroidism: Secondary | ICD-10-CM

## 2015-11-12 ENCOUNTER — Other Ambulatory Visit: Payer: Self-pay | Admitting: Endocrinology

## 2015-11-12 ENCOUNTER — Other Ambulatory Visit (INDEPENDENT_AMBULATORY_CARE_PROVIDER_SITE_OTHER): Payer: 59

## 2015-11-12 DIAGNOSIS — E890 Postprocedural hypothyroidism: Secondary | ICD-10-CM

## 2015-11-12 LAB — TSH: TSH: 0.75 u[IU]/mL (ref 0.35–4.50)

## 2015-11-12 MED ORDER — LEVOTHYROXINE SODIUM 175 MCG PO TABS: 175.0000 ug | ORAL_TABLET | Freq: Every day | ORAL | Status: DC

## 2016-05-14 ENCOUNTER — Ambulatory Visit: Payer: 59 | Admitting: Endocrinology

## 2016-07-30 ENCOUNTER — Other Ambulatory Visit: Payer: Self-pay | Admitting: Endocrinology

## 2016-10-13 ENCOUNTER — Ambulatory Visit (INDEPENDENT_AMBULATORY_CARE_PROVIDER_SITE_OTHER): Payer: 59 | Admitting: Allergy and Immunology

## 2016-10-13 ENCOUNTER — Encounter: Payer: Self-pay | Admitting: Allergy and Immunology

## 2016-10-13 VITALS — BP 124/86 | HR 68 | Resp 16 | Ht 65.83 in | Wt 136.4 lb

## 2016-10-13 DIAGNOSIS — J3089 Other allergic rhinitis: Secondary | ICD-10-CM | POA: Diagnosis not present

## 2016-10-13 DIAGNOSIS — K219 Gastro-esophageal reflux disease without esophagitis: Secondary | ICD-10-CM

## 2016-10-13 DIAGNOSIS — T63441D Toxic effect of venom of bees, accidental (unintentional), subsequent encounter: Secondary | ICD-10-CM | POA: Diagnosis not present

## 2016-10-13 DIAGNOSIS — Z91018 Allergy to other foods: Secondary | ICD-10-CM

## 2016-10-13 MED ORDER — MUPIROCIN 2 % EX OINT: TOPICAL_OINTMENT | CUTANEOUS | 0 refills | Status: DC

## 2016-10-13 MED ORDER — AUVI-Q 0.3 MG/0.3ML IJ SOAJ: INTRAMUSCULAR | 3 refills | Status: DC

## 2016-10-13 MED ORDER — CICLESONIDE 50 MCG/ACT NA SUSP: NASAL | 11 refills | Status: DC

## 2016-10-13 NOTE — Patient Instructions (Addendum)
  1. Auvi-Q 3.0, Benadryl, M.D./ER evaluation for allergic reaction  2. Treat excoriation of septum with nasal saline followed by Bactroban ointment inside nose 3 times a day for the next 10 days  3. Can use anti-inflammatory preventative therapy for her upper airways with Omnaris one spray each nostril 3-7 times a week. Takes days to work  4. Can treat reflux with the following:   A. consolidate caffeine, chocolate, and alcohol  B. OTC ranitidine 150 - 2 tablets once a day  5. Can use over-the-counter nasal saline and Zyrtec or Claritin as needed  6. Return to clinic in 1 year or earlier if problem.

## 2016-10-13 NOTE — Progress Notes (Signed)
Follow-up Note  Referring Provider: Lavone Orn, MD Primary Provider: Irven Shelling, MD Date of Office Visit: 10/13/2016  Subjective:   Mary Key (DOB: 04-Apr-1956) is a 61 y.o. female who returns to the [facility] on 10/13/2016 in re-evaluation of the following:  HPI: Mary Key returns to this clinic in reevaluation of her allergic rhinoconjunctivitis, food hypersensitivity state, Hymenoptera venom hypersensitivity state and reflux. I've not seen her in his clinic since July 2016.  She was unable to tolerate montelukast because of panic attacks and has not been consistently using a nasal steroid and thus has developed problems with nasal congestion and sneezing and facial aches on a pretty regular basis. She does not have any significant issues with snoring although she does have some issues with mouth breathing on occasion. She has no symptoms to suggest that she has sleep apnea.  She has been dealing with some nasal irritation on both sides over the course of the past several months. She's noticed some bleeding and some peeling of the skin inside her nose.  She remains away from Hymenoptera venom and from tree nuts and kiwi. She is not interested in undergoing a course of immunotherapy for her Hymenoptera venom allergy.  She's had issues with raspy voice and slight cough which is been a long-standing issue of many years. She does have reflux but she does not treat reflux at this point in time. She still continues to drink caffeine on a daily basis.  Since I have last seen her in this clinic she was treated for papillary thyroid cancer with thyroidectomy and radioactive iodine.  Allergies as of 10/13/2016      Reactions   Other Anaphylaxis   [place] Nuts   Almond (diagnostic) Other (See Comments)   Itchy throat.   Bee Venom    Other reaction(s): ANAPHYLAXIS   Kiwi Extract Other (See Comments)   Itchy throat   Singulair [montelukast Sodium] Other (See  Comments)   anxiety      Medication List      amphetamine-dextroamphetamine 10 MG tablet Commonly known as:  ADDERALL Take 5-10 mg by mouth 2 (two) times daily. 10 mg in the morning 5 mg midday   buPROPion 150 MG 12 hr tablet Commonly known as:  ZYBAN Take 150 mg by mouth every morning.   busPIRone 15 MG tablet Commonly known as:  BUSPAR Take 7.5-15 mg by mouth 2 (two) times daily. 15 mg ins the morning and 7.5 mg midday   CHEWABLE CALCIUM PO Take by mouth.   cholecalciferol 1000 units tablet Commonly known as:  VITAMIN D Take 2,000 Units by mouth every morning.   ESTRACE VAGINAL 0.1 MG/GM vaginal cream Generic drug:  estradiol Place 1 Applicatorful vaginally 2 (two) times a week.   FLUoxetine 10 MG tablet Commonly known as:  PROZAC Take 10 mg by mouth every morning.   ibuprofen 200 MG tablet Commonly known as:  ADVIL,MOTRIN Take 200 mg by mouth every 6 (six) hours as needed for moderate pain.   levothyroxine 137 MCG tablet Commonly known as:  SYNTHROID, LEVOTHROID   TURMERIC PO Take by mouth daily.   vitamin B-12 1000 MCG tablet Commonly known as:  CYANOCOBALAMIN Take by mouth.   zolpidem 5 MG tablet Commonly known as:  AMBIEN Take 5 mg by mouth at bedtime as needed for sleep.       Past Medical History:  Diagnosis Date  . ADD (attention deficit disorder)   . Anxiety   .  Arthritis   . Depression   . Difficulty sleeping    takes ambien PRN  . GERD (gastroesophageal reflux disease)    eats carefully   . Hx of hepatitis C    cured with medication  . Thyroid cancer [facility])     Past Surgical History:  Procedure Laterality Date  . THYROIDECTOMY N/A 06/13/2015   Procedure: TOTAL THYROIDECTOMY;  Surgeon: Armandina Gemma, MD;  Location: [facility];  Service: General;  Laterality: N/A;  . TONSILLECTOMY      Review of systems negative except as noted in HPI / PMHx or noted below:  Review of Systems  Constitutional: Negative.   HENT: Negative.   Eyes:  Negative.   Respiratory: Negative.   Cardiovascular: Negative.   Gastrointestinal: Negative.   Genitourinary: Negative.   Musculoskeletal: Negative.   Skin: Negative.   Neurological: Negative.   Endo/Heme/Allergies: Negative.   Psychiatric/Behavioral: Negative.      Objective:   Vitals:   10/13/16 1123  BP: 124/86  Pulse: 68  Resp: 16   Height: 5' 5.83" (167.2 cm)  Weight: 136 lb 6.4 oz (61.9 kg)   Physical Exam  Constitutional: She is well-developed, well-nourished, and in no distress.  Slightly raspy voice  HENT:  Head: Normocephalic.  Right Ear: Tympanic membrane, external ear and ear canal normal.  Left Ear: Tympanic membrane, external ear and ear canal normal.  Nose: Nose normal. No mucosal edema or rhinorrhea.  Mouth/Throat: Uvula is midline, oropharynx is clear and moist and mucous membranes are normal. No oropharyngeal exudate.  Eyes: Conjunctivae are normal.  Neck: Trachea normal. No tracheal tenderness present. No tracheal deviation present. No thyromegaly present.  Cardiovascular: Normal rate, regular rhythm, S1 normal, S2 normal and normal heart sounds.   No murmur heard. Pulmonary/Chest: Breath sounds normal. No stridor. No respiratory distress. She has no wheezes. She has no rales.  Musculoskeletal: She exhibits no edema.  Lymphadenopathy:       Head (right side): No tonsillar adenopathy present.       Head (left side): No tonsillar adenopathy present.    She has no cervical adenopathy.  Neurological: She is alert. Gait normal.  Skin: No rash noted. She is not diaphoretic. No erythema. Nails show no clubbing.  Psychiatric: Mood and affect normal.    Diagnostics: none   Assessment and Plan:   1. Other allergic rhinitis   2. Bee sting reaction, accidental or unintentional, subsequent encounter   3. Food allergy   4. LPRD (laryngopharyngeal reflux disease)     1. Auvi-Q 3.0, Benadryl, M.D./ER evaluation for allergic reaction  2. Treat excoriation  of septum with nasal saline followed by Bactroban ointment inside nose 3 times a day for the next 10 days  3. Can use anti-inflammatory preventative therapy for her upper airways with Omnaris one spray each nostril 3-7 times a week.   4. Can treat reflux with the following:   A. consolidate caffeine, chocolate, and alcohol  B. OTC ranitidine 150 - 2 tablets once a day  5. Can use over-the-counter nasal saline and Zyrtec or Claritin as needed  6. Return to clinic in 1 year or earlier if problem.  Jezabelle has some active issues and I tried to address each one of these issues with the therapy noted above. Certainly she has some form of nasal excoriation and hopefully this will heal up with Bactroban. She has a tree allergy and she is going to have a difficult time the spring with pollen exposure and I've asked  her to use some preventative nasal steroids once her nose clears up from her excoriation. I've given her a injectable epinephrine device to treat a allergic reaction that may occur following Hymenoptera venom exposure or tree nut consumption. Obviously she knows not to have exposure to these agents. Finally, I did have a talk with her today about her reflux and LPR and some of the behavioral modifications that she can perform to treat this issue and some of the medication she can use to treat this issue. I'll see her back in this clinic in 1 year or earlier if there is a problem.  [name], MD [place]

## 2016-10-21 ENCOUNTER — Telehealth: Payer: Self-pay | Admitting: *Deleted

## 2016-10-21 MED ORDER — CICLESONIDE 37 MCG/ACT NA AERS: 1.0000 | INHALATION_SPRAY | Freq: Every day | NASAL | 5 refills | Status: DC

## 2016-10-21 NOTE — Telephone Encounter (Signed)
Sent script into pharmacy.

## 2016-10-21 NOTE — Telephone Encounter (Signed)
Please provide Roseanne Reno as this is omnaris in a different form. One spray each nostril one time per day.

## 2016-10-21 NOTE — Telephone Encounter (Signed)
Patient's insurance no longer cover Omnaris. Preferred is Fluticasone, Flunisolide or Zetonna. Please advise.

## 2016-10-21 NOTE — Telephone Encounter (Signed)
Left message informing patient on change in nasal sprays.

## 2016-11-13 DIAGNOSIS — C73 Malignant neoplasm of thyroid gland: Secondary | ICD-10-CM | POA: Diagnosis not present

## 2016-12-14 DIAGNOSIS — Z6822 Body mass index (BMI) 22.0-22.9, adult: Secondary | ICD-10-CM | POA: Diagnosis not present

## 2016-12-14 DIAGNOSIS — Z01419 Encounter for gynecological examination (general) (routine) without abnormal findings: Secondary | ICD-10-CM | POA: Diagnosis not present

## 2016-12-27 DIAGNOSIS — Z23 Encounter for immunization: Secondary | ICD-10-CM | POA: Diagnosis not present

## 2016-12-31 ENCOUNTER — Ambulatory Visit
Admission: RE | Admit: 2016-12-31 | Discharge: 2016-12-31 | Disposition: A | Discharge status: Home / Self Care | Payer: 59 | Source: Ambulatory Visit | Attending: Internal Medicine | Admitting: Internal Medicine

## 2016-12-31 ENCOUNTER — Other Ambulatory Visit: Payer: Self-pay | Admitting: Internal Medicine

## 2016-12-31 DIAGNOSIS — R471 Dysarthria and anarthria: Secondary | ICD-10-CM

## 2016-12-31 DIAGNOSIS — R4789 Other speech disturbances: Secondary | ICD-10-CM | POA: Diagnosis not present

## 2016-12-31 DIAGNOSIS — R410 Disorientation, unspecified: Secondary | ICD-10-CM | POA: Diagnosis not present

## 2017-01-15 DIAGNOSIS — E039 Hypothyroidism, unspecified: Secondary | ICD-10-CM | POA: Diagnosis not present

## 2017-01-26 DIAGNOSIS — G454 Transient global amnesia: Secondary | ICD-10-CM | POA: Diagnosis not present

## 2017-02-07 IMAGING — US US SOFT TISSUE HEAD/NECK
1 series · 14 of 25 positions shown · non-contrast
Comparison: None.

CLINICAL DATA: 58-year-old female with a history of thyroid nodules

EXAM:
THYROID ULTRASOUND
TECHNIQUE: Ultrasound examination of the thyroid gland and adjacent soft
tissues was performed.

[Series 1: us soft tissue head/neck · 0.08mm/px · 14 of 55 slices shown]
[im 1/55]
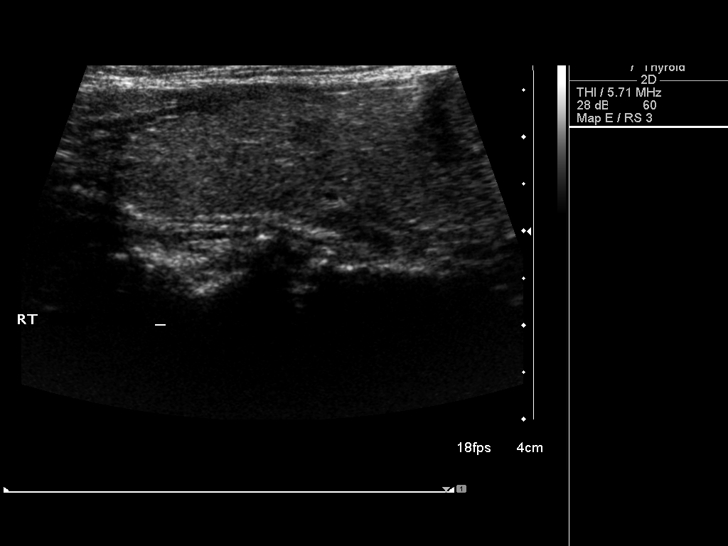
[im 5/55]
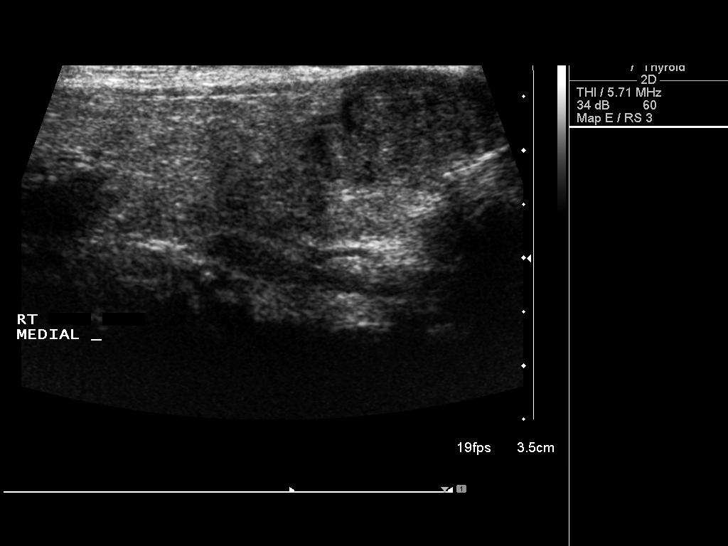
[im 10/55]
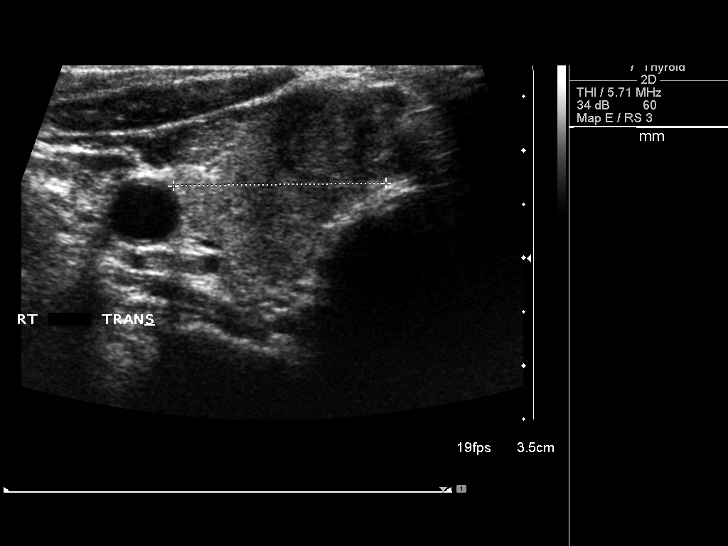
[im 14/55]
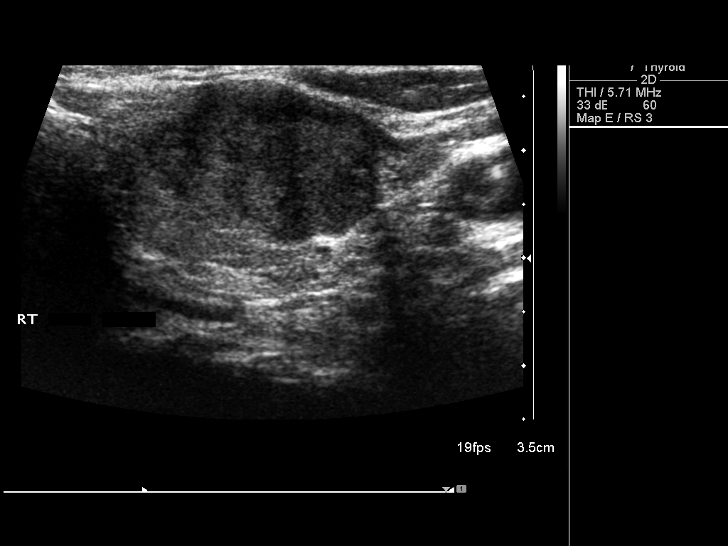
[im 19/55]
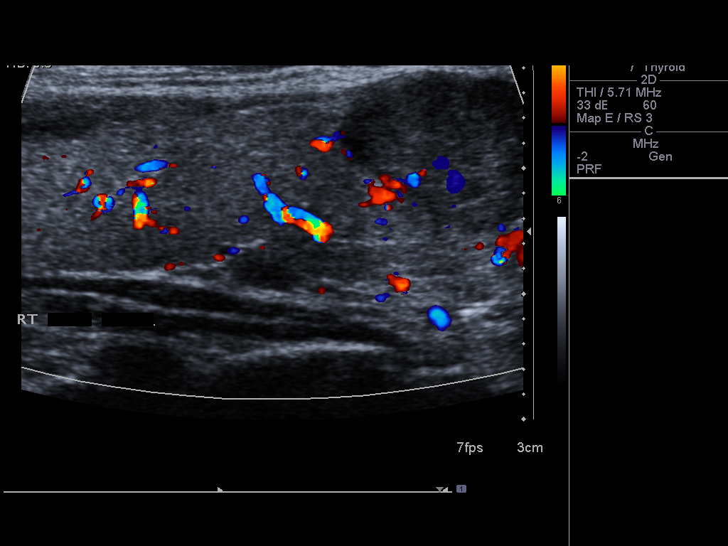
[im 21/55]
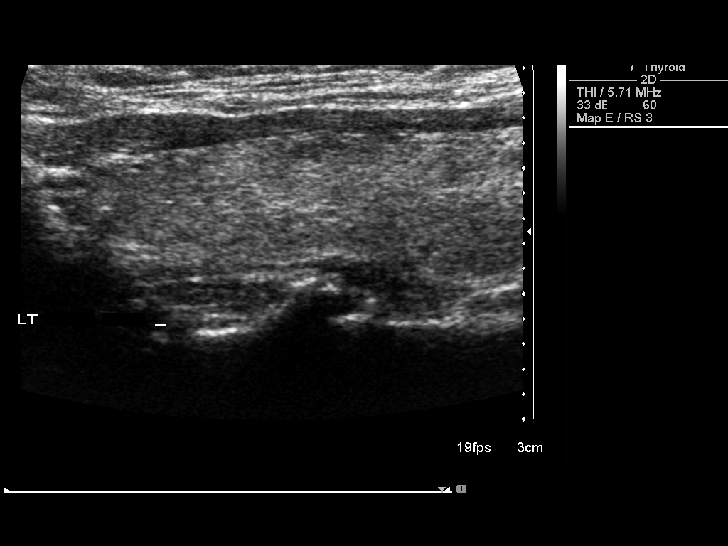
[im 25/55]
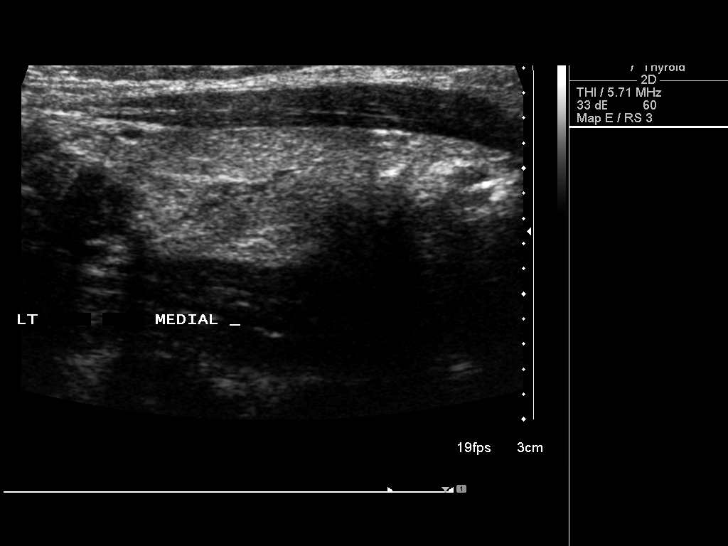
[im 30/55]
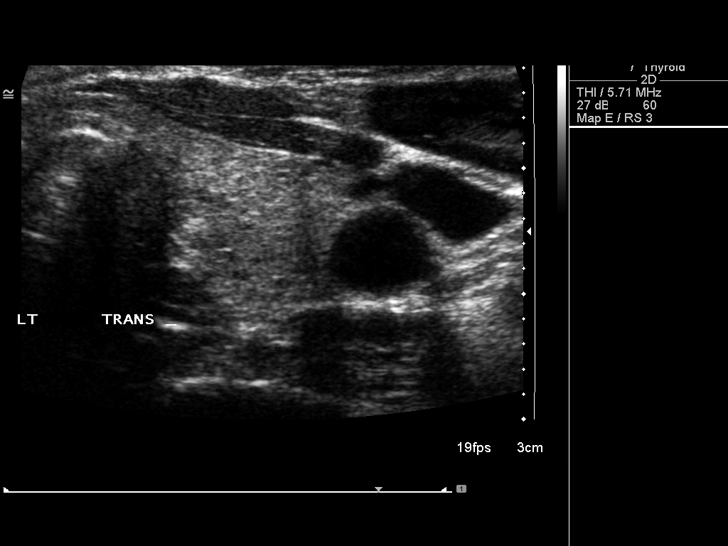
[im 34/55]
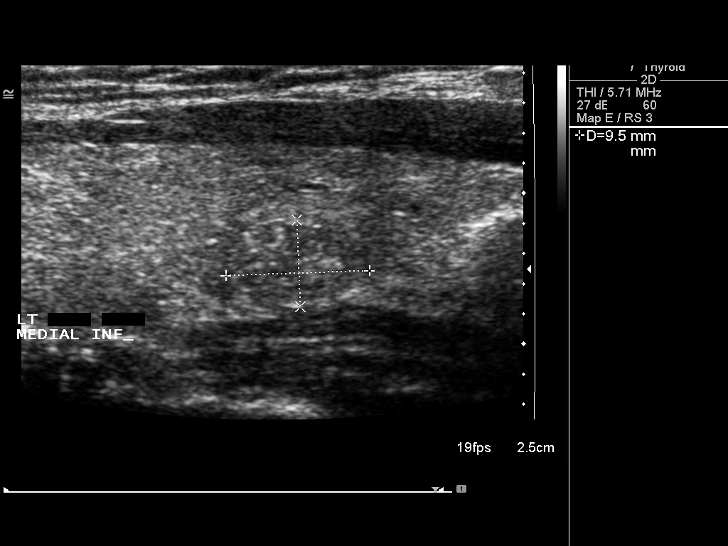
[im 37/55]
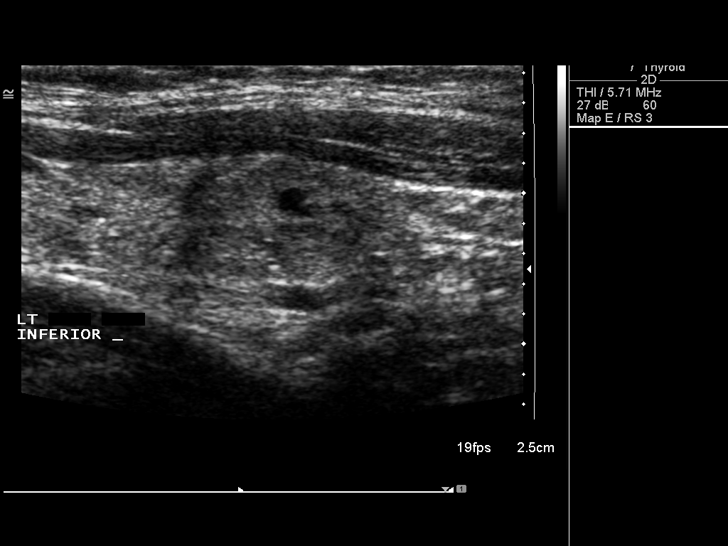
[im 41/55]
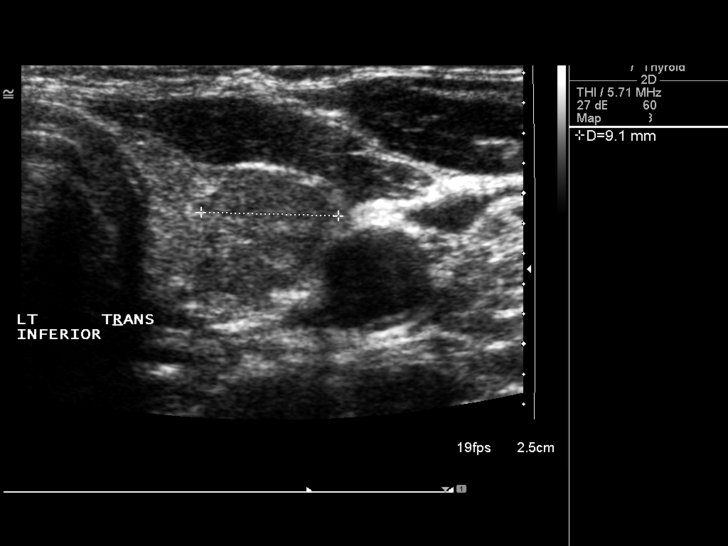
[im 46/55]
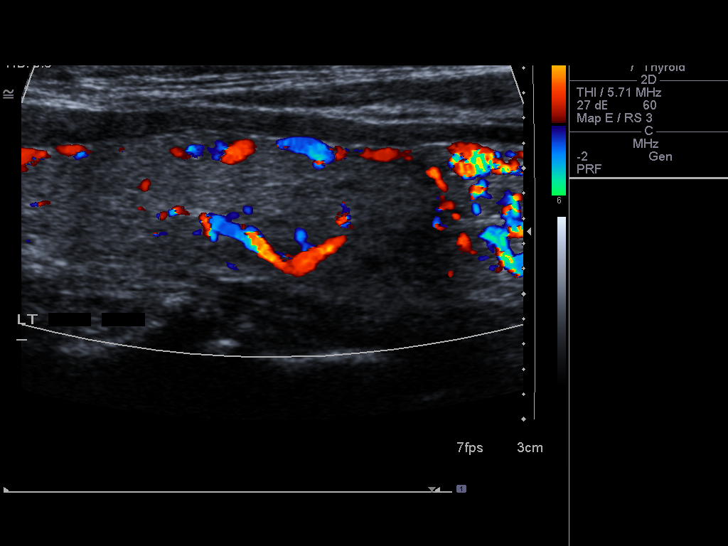
[im 50/55]
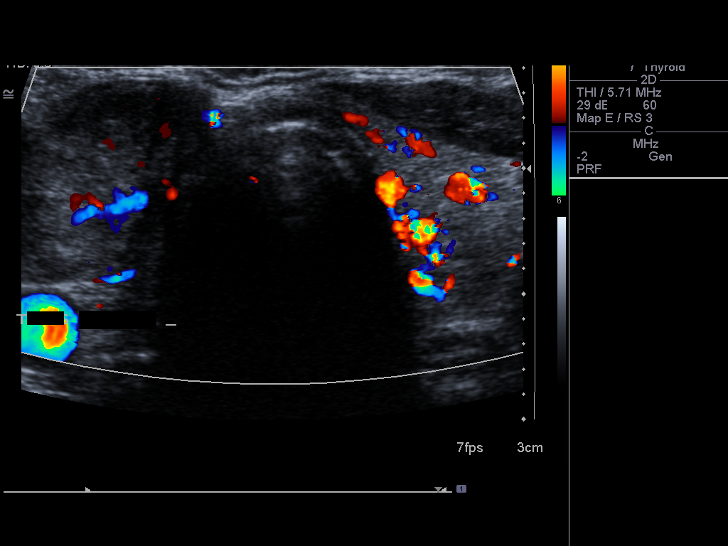
[im 55/55]
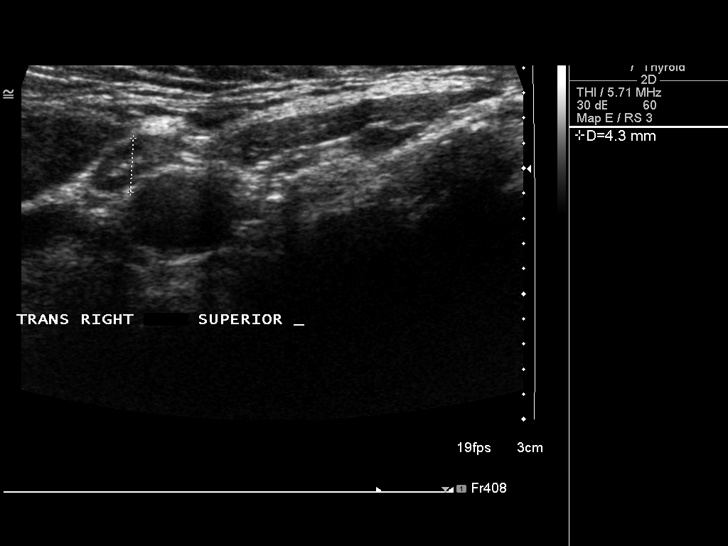

[14 of 25 positions shown; findings below may reference images not displayed]

FINDINGS: Right thyroid lobe

Measurements: 5.4 cm x 1.9 cm x 2.0 cm. Heterogeneous appearance of
the right thyroid tissue. Single solid nodule inferiorly measures
2.4 cm x 1.6 cm x 1.8 cm

Left thyroid lobe

Measurements: 5.4 cm x 1.4 cm x 1.4 cm. Heterogeneous appearance of
the left thyroid. Single inferior nodule measures 1.6 cm x 9 mm x 9
mm with internal reflectors. This may or may not be part of the same
process as an adjacent 10 mm nodule.

Isthmus

Thickness: 1 mm.  No nodules visualized.

Lymphadenopathy

None visualized.
IMPRESSION: Multinodular thyroid with nodules at the inferior right and inferior
left lobe meeting criteria for biopsy.

Findings meet consensus criteria for biopsy. Ultrasound-guided fine
needle aspiration should be considered, as per the consensus
statement: Management of Thyroid Nodules Detected at US: Society of
Radiologists in Ultrasound Consensus Conference Statement. Radiology
3007; [DATE].

## 2017-03-03 DIAGNOSIS — Z23 Encounter for immunization: Secondary | ICD-10-CM | POA: Diagnosis not present

## 2017-03-03 DIAGNOSIS — Z0000 Encounter for general adult medical examination without abnormal findings: Secondary | ICD-10-CM | POA: Diagnosis not present

## 2017-03-22 DIAGNOSIS — R198 Other specified symptoms and signs involving the digestive system and abdomen: Secondary | ICD-10-CM | POA: Diagnosis not present

## 2017-03-22 DIAGNOSIS — Z1210 Encounter for screening for malignant neoplasm of intestinal tract, unspecified: Secondary | ICD-10-CM | POA: Diagnosis not present

## 2017-04-02 IMAGING — CR DG CHEST 2V
2 series · 2 of 2 positions shown · non-contrast
Comparison: No priors.

CLINICAL DATA: 58-year-old female status post total thyroidectomy
on 06/13/2015. Chronic cough. Nonsmoker.

EXAM:
CHEST  2 VIEW

[w chest pa]
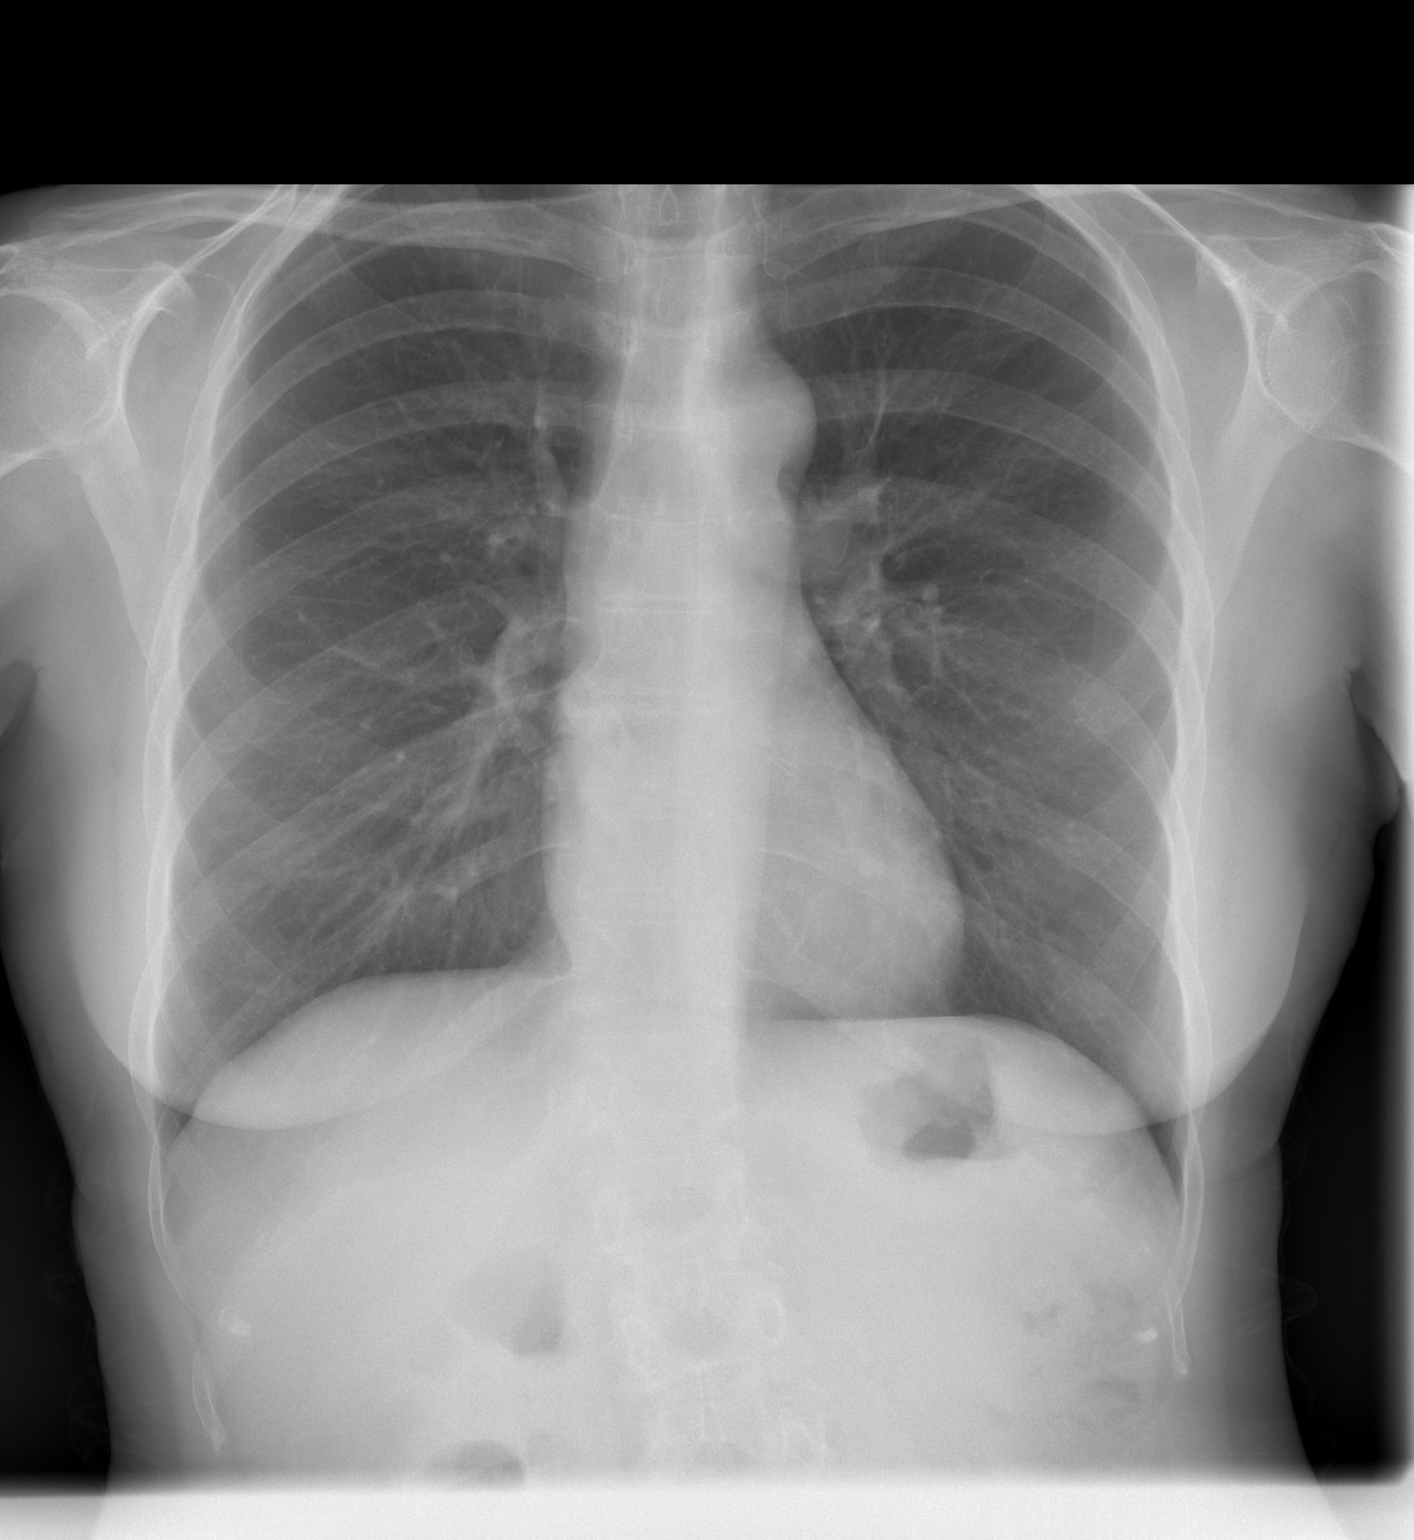

[w chest lat]
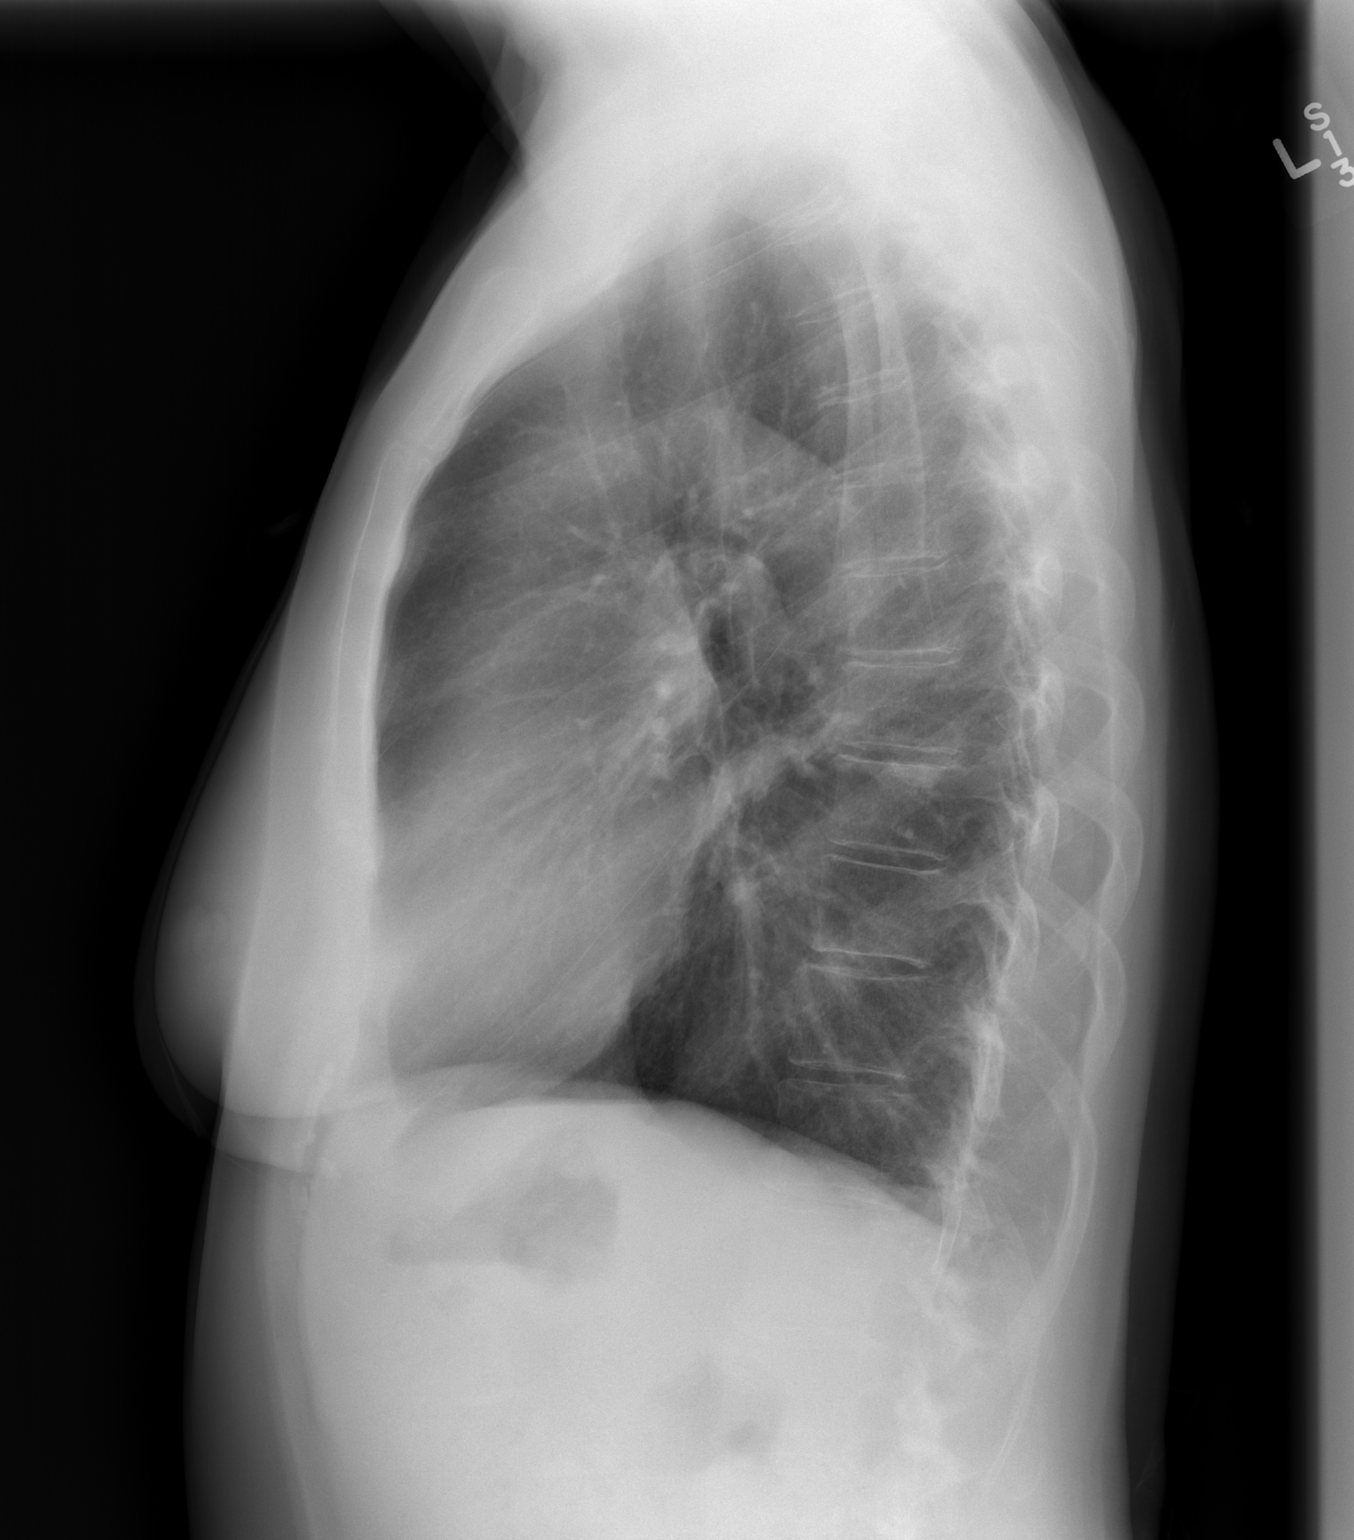

[2 of 2 positions shown; findings below may reference images not displayed]

FINDINGS: Both Lung volumes are normal. No consolidative airspace disease. No
pleural effusions. No pneumothorax. No pulmonary nodule or mass
noted. Pulmonary vasculature and the cardiomediastinal silhouette
are within normal limits.
IMPRESSION: No radiographic evidence of acute cardiopulmonary disease.

## 2017-04-19 DIAGNOSIS — Z1211 Encounter for screening for malignant neoplasm of colon: Secondary | ICD-10-CM | POA: Diagnosis not present

## 2017-08-13 DIAGNOSIS — R232 Flushing: Secondary | ICD-10-CM | POA: Diagnosis not present

## 2017-08-13 DIAGNOSIS — R002 Palpitations: Secondary | ICD-10-CM | POA: Diagnosis not present

## 2017-08-13 DIAGNOSIS — C73 Malignant neoplasm of thyroid gland: Secondary | ICD-10-CM | POA: Diagnosis not present

## 2017-08-16 DIAGNOSIS — K219 Gastro-esophageal reflux disease without esophagitis: Secondary | ICD-10-CM | POA: Diagnosis not present

## 2017-09-05 DIAGNOSIS — Z23 Encounter for immunization: Secondary | ICD-10-CM | POA: Diagnosis not present

## 2017-09-24 DIAGNOSIS — K219 Gastro-esophageal reflux disease without esophagitis: Secondary | ICD-10-CM | POA: Diagnosis not present

## 2017-09-24 DIAGNOSIS — K449 Diaphragmatic hernia without obstruction or gangrene: Secondary | ICD-10-CM | POA: Diagnosis not present

## 2017-09-27 DIAGNOSIS — E890 Postprocedural hypothyroidism: Secondary | ICD-10-CM | POA: Diagnosis not present

## 2017-10-29 DIAGNOSIS — C73 Malignant neoplasm of thyroid gland: Secondary | ICD-10-CM | POA: Diagnosis not present

## 2017-12-10 DIAGNOSIS — E039 Hypothyroidism, unspecified: Secondary | ICD-10-CM | POA: Diagnosis not present

## 2018-01-19 DIAGNOSIS — Z6822 Body mass index (BMI) 22.0-22.9, adult: Secondary | ICD-10-CM | POA: Diagnosis not present

## 2018-01-19 DIAGNOSIS — Z01419 Encounter for gynecological examination (general) (routine) without abnormal findings: Secondary | ICD-10-CM | POA: Diagnosis not present

## 2018-01-25 ENCOUNTER — Encounter: Payer: Self-pay | Admitting: Allergy and Immunology

## 2018-01-25 ENCOUNTER — Ambulatory Visit: Payer: 59 | Admitting: Allergy and Immunology

## 2018-01-25 VITALS — BP 120/72 | HR 76 | Resp 16

## 2018-01-25 DIAGNOSIS — K219 Gastro-esophageal reflux disease without esophagitis: Secondary | ICD-10-CM

## 2018-01-25 DIAGNOSIS — Z91018 Allergy to other foods: Secondary | ICD-10-CM | POA: Diagnosis not present

## 2018-01-25 DIAGNOSIS — T63441D Toxic effect of venom of bees, accidental (unintentional), subsequent encounter: Secondary | ICD-10-CM | POA: Diagnosis not present

## 2018-01-25 DIAGNOSIS — J3089 Other allergic rhinitis: Secondary | ICD-10-CM

## 2018-01-25 MED ORDER — AUVI-Q 0.3 MG/0.3ML IJ SOAJ: INTRAMUSCULAR | 2 refills | Status: DC

## 2018-01-25 MED ORDER — AZELASTINE HCL 0.1 % NA SOLN: 1.0000 | Freq: Two times a day (BID) | NASAL | 5 refills | Status: DC

## 2018-01-25 NOTE — Progress Notes (Signed)
Follow-up Note  Referring Provider: Lavone Orn, MD Primary Provider: Lavone Orn, MD Date of Office Visit: 01/25/2018  Subjective:   Mary Key (DOB: 11-15-1955) is a 62 y.o. female who returns to the [facility] on 01/25/2018 in re-evaluation of the following:  HPI: Lori-Ann presents to this clinic in evaluation of allergic rhinoconjunctivitis, hymenoptera venom hypersensitivity state, reflux, and a history of food allergy.  Her last visit to this clinic was 13 October 2016.  Overall she has done relatively well with her upper airways.  When I last saw her in this clinic she did have a nasal septal ulcer that cleared up with Bactroban.  She is now uses Flonase 1 spray each nostril once a day and over-the-counter antihistamine and saline.  There are few days when she gets congested and just feels as though her allergies are flaring and she wonders if there is a acute medication she can use during these periods of time.  Her reflux is under pretty good control with some intermittent use of Zantac.  She is very cognizant of her caffeine and chocolate and alcohol consumption at this point.  She had an upper endoscopy in July 2018 which was without any obvious abnormality.  She can eat almonds and walnuts and cashews and hazelnut at this point.  She stays away from eating kiwi.  Allergies as of 01/25/2018      Reactions   Other Anaphylaxis, Cough   [place] Nuts Other reaction(s): ANAPHYLAXIS Other reaction(s): ANAPHYLAXIS   Almond (diagnostic) Other (See Comments)   Itchy throat.   Bee Venom    Other reaction(s): ANAPHYLAXIS   Kiwi Extract Other (See Comments)   Itchy throat   Singulair [montelukast Sodium] Other (See Comments)   anxiety      Medication List      amphetamine-dextroamphetamine 10 MG tablet Commonly known as:  ADDERALL Take 15 mg by mouth 2 (two) times daily. 10 mg in the morning 5 mg midday   AUVI-Q 0.3 mg/0.3 mL Soaj injection Generic drug:   EPINEPHrine Use as directed for life-threatening allergic reaction.   buPROPion 150 MG 12 hr tablet Commonly known as:  ZYBAN Take 150 mg by mouth every morning.   busPIRone 15 MG tablet Commonly known as:  BUSPAR Take 7.5-15 mg by mouth 2 (two) times daily. 15 mg ins the morning and 7.5 mg midday   cholecalciferol 1000 units tablet Commonly known as:  VITAMIN D Take 2,000 Units by mouth every morning.   co-enzyme Q-10 30 MG capsule Take 30 mg by mouth 3 (three) times daily.   ESTRACE VAGINAL 0.1 MG/GM vaginal cream Generic drug:  estradiol Place 1 Applicatorful vaginally 2 (two) times a week.   FLUoxetine 10 MG tablet Commonly known as:  PROZAC Take 5 mg by mouth every morning.   fluticasone 50 MCG/ACT nasal spray Commonly known as:  FLONASE Place into both nostrils daily.   ibuprofen 200 MG tablet Commonly known as:  ADVIL,MOTRIN Take 200 mg by mouth every 6 (six) hours as needed for moderate pain.   KLS ALLERCLEAR PO Take by mouth.   SYNTHROID 150 MCG tablet Generic drug:  levothyroxine   TURMERIC PO Take by mouth daily.   vitamin B-12 1000 MCG tablet Commonly known as:  CYANOCOBALAMIN Take by mouth.   zolpidem 5 MG tablet Commonly known as:  AMBIEN Take 5 mg by mouth at bedtime as needed for sleep.       Past Medical History:  Diagnosis  Date  . ADD (attention deficit disorder)   . Anxiety   . Arthritis   . Depression   . Difficulty sleeping    takes ambien PRN  . GERD (gastroesophageal reflux disease)    eats carefully   . Hx of hepatitis C    cured with medication  . Thyroid cancer [facility])     Past Surgical History:  Procedure Laterality Date  . THYROIDECTOMY N/A 06/13/2015   Procedure: TOTAL THYROIDECTOMY;  Surgeon: Armandina Gemma, MD;  Location: [facility];  Service: General;  Laterality: N/A;  . TONSILLECTOMY      Review of systems negative except as noted in HPI / PMHx or noted below:  Review of Systems  Constitutional: Negative.   HENT:  Negative.   Eyes: Negative.   Respiratory: Negative.   Cardiovascular: Negative.   Gastrointestinal: Negative.   Genitourinary: Negative.   Musculoskeletal: Negative.   Skin: Negative.   Neurological: Negative.   Endo/Heme/Allergies: Negative.   Psychiatric/Behavioral: Negative.      Objective:   Vitals:   01/25/18 1649  BP: 120/72  Pulse: 76  Resp: 16          Physical Exam  HENT:  Head: Normocephalic.  Right Ear: Tympanic membrane, external ear and ear canal normal.  Left Ear: Tympanic membrane, external ear and ear canal normal.  Nose: Nose normal. No mucosal edema or rhinorrhea.  Mouth/Throat: Uvula is midline, oropharynx is clear and moist and mucous membranes are normal. No oropharyngeal exudate.  Eyes: Conjunctivae are normal.  Neck: Trachea normal. No tracheal tenderness present. No tracheal deviation present. No thyromegaly present.  Cardiovascular: Normal rate, regular rhythm, S1 normal, S2 normal and normal heart sounds.  No murmur heard. Pulmonary/Chest: Breath sounds normal. No stridor. No respiratory distress. She has no wheezes. She has no rales.  Musculoskeletal: She exhibits no edema.  Lymphadenopathy:       Head (right side): No tonsillar adenopathy present.       Head (left side): No tonsillar adenopathy present.    She has no cervical adenopathy.  Neurological: She is alert.  Skin: No rash noted. She is not diaphoretic. No erythema. Nails show no clubbing.    Diagnostics: none  Assessment and Plan:   1. Other allergic rhinitis   2. Bee sting reaction, accidental or unintentional, subsequent encounter   3. Food allergy   4. LPRD (laryngopharyngeal reflux disease)     1. Auvi-Q 3.0, Benadryl, M.D./ER evaluation for allergic reaction  2. Continue OTC Flonase and antihistamine and saline   3. Can use azelastine 1 spray each nostril 2 times per day if needed  4. Can treat reflux with the following:   A. consolidate caffeine, chocolate,  and alcohol  B. OTC ranitidine 150 - 2 tablets once a day  5. Return to clinic in 1 year or earlier if problem.  Overall Sukari appears to be doing relatively well with her atopic disease.  She has the option of adding nasal Azelastine in addition to her other medications for her allergic rhinitis.  She can treat reflux by using over-the-counter ranitidine and being very careful about using caffeine and chocolate and alcohol.  We have had a discussion in the past about undergoing evaluation in venom clinic and she has never stepped forward to have this evaluation performed. We will provide her with an EpiPen.  I will see her in this clinic in 1 year or earlier if there is a problem.  [name], MD Allergy / Immunology [facility]

## 2018-01-25 NOTE — Patient Instructions (Addendum)
  1. Auvi-Q 3.0, Benadryl, M.D./ER evaluation for allergic reaction  2. Continue OTC Flonase and antihistamine and saline   3. Can use azelastine 1 spray each nostril 2 times per day if needed  4. Can treat reflux with the following:   A. consolidate caffeine, chocolate, and alcohol  B. OTC ranitidine 150 - 2 tablets once a day  5. Return to clinic in 1 year or earlier if problem.

## 2018-01-26 ENCOUNTER — Encounter: Payer: Self-pay | Admitting: Allergy and Immunology

## 2018-03-09 DIAGNOSIS — M9902 Segmental and somatic dysfunction of thoracic region: Secondary | ICD-10-CM | POA: Diagnosis not present

## 2018-03-10 ENCOUNTER — Other Ambulatory Visit: Payer: Self-pay | Admitting: Obstetrics and Gynecology

## 2018-03-10 DIAGNOSIS — M810 Age-related osteoporosis without current pathological fracture: Secondary | ICD-10-CM

## 2018-03-16 DIAGNOSIS — M9902 Segmental and somatic dysfunction of thoracic region: Secondary | ICD-10-CM | POA: Diagnosis not present

## 2018-03-16 DIAGNOSIS — M9903 Segmental and somatic dysfunction of lumbar region: Secondary | ICD-10-CM | POA: Diagnosis not present

## 2018-03-16 DIAGNOSIS — M9905 Segmental and somatic dysfunction of pelvic region: Secondary | ICD-10-CM | POA: Diagnosis not present

## 2018-03-22 DIAGNOSIS — M9902 Segmental and somatic dysfunction of thoracic region: Secondary | ICD-10-CM | POA: Diagnosis not present

## 2018-03-22 DIAGNOSIS — M9905 Segmental and somatic dysfunction of pelvic region: Secondary | ICD-10-CM | POA: Diagnosis not present

## 2018-03-22 DIAGNOSIS — M9903 Segmental and somatic dysfunction of lumbar region: Secondary | ICD-10-CM | POA: Diagnosis not present

## 2018-03-24 DIAGNOSIS — Z0000 Encounter for general adult medical examination without abnormal findings: Secondary | ICD-10-CM | POA: Diagnosis not present

## 2018-03-24 DIAGNOSIS — M1812 Unilateral primary osteoarthritis of first carpometacarpal joint, left hand: Secondary | ICD-10-CM | POA: Diagnosis not present

## 2018-03-24 DIAGNOSIS — M79604 Pain in right leg: Secondary | ICD-10-CM | POA: Diagnosis not present

## 2018-03-24 DIAGNOSIS — Z136 Encounter for screening for cardiovascular disorders: Secondary | ICD-10-CM | POA: Diagnosis not present

## 2018-03-24 DIAGNOSIS — M1811 Unilateral primary osteoarthritis of first carpometacarpal joint, right hand: Secondary | ICD-10-CM | POA: Diagnosis not present

## 2018-03-30 DIAGNOSIS — M9903 Segmental and somatic dysfunction of lumbar region: Secondary | ICD-10-CM | POA: Diagnosis not present

## 2018-03-30 DIAGNOSIS — M9905 Segmental and somatic dysfunction of pelvic region: Secondary | ICD-10-CM | POA: Diagnosis not present

## 2018-03-30 DIAGNOSIS — M9902 Segmental and somatic dysfunction of thoracic region: Secondary | ICD-10-CM | POA: Diagnosis not present

## 2018-04-13 DIAGNOSIS — M9905 Segmental and somatic dysfunction of pelvic region: Secondary | ICD-10-CM | POA: Diagnosis not present

## 2018-04-13 DIAGNOSIS — M9902 Segmental and somatic dysfunction of thoracic region: Secondary | ICD-10-CM | POA: Diagnosis not present

## 2018-04-13 DIAGNOSIS — M9903 Segmental and somatic dysfunction of lumbar region: Secondary | ICD-10-CM | POA: Diagnosis not present

## 2018-04-19 DIAGNOSIS — H02402 Unspecified ptosis of left eyelid: Secondary | ICD-10-CM | POA: Diagnosis not present

## 2018-04-28 DIAGNOSIS — K1321 Leukoplakia of oral mucosa, including tongue: Secondary | ICD-10-CM | POA: Diagnosis not present

## 2018-04-28 DIAGNOSIS — Z7289 Other problems related to lifestyle: Secondary | ICD-10-CM | POA: Diagnosis not present

## 2018-04-28 DIAGNOSIS — Z85850 Personal history of malignant neoplasm of thyroid: Secondary | ICD-10-CM | POA: Diagnosis not present

## 2018-04-29 ENCOUNTER — Other Ambulatory Visit: Payer: 59

## 2018-05-02 ENCOUNTER — Other Ambulatory Visit: Payer: Self-pay | Admitting: Ophthalmology

## 2018-05-02 DIAGNOSIS — C441192 Basal cell carcinoma of skin of left lower eyelid, including canthus: Secondary | ICD-10-CM | POA: Diagnosis not present

## 2018-05-02 DIAGNOSIS — C44101 Unspecified malignant neoplasm of skin of unspecified eyelid, including canthus: Secondary | ICD-10-CM | POA: Diagnosis not present

## 2018-05-12 DIAGNOSIS — C441192 Basal cell carcinoma of skin of left lower eyelid, including canthus: Secondary | ICD-10-CM | POA: Diagnosis not present

## 2018-05-18 DIAGNOSIS — C441992 Other specified malignant neoplasm of skin of left lower eyelid, including canthus: Secondary | ICD-10-CM | POA: Diagnosis not present

## 2018-06-08 DIAGNOSIS — M9903 Segmental and somatic dysfunction of lumbar region: Secondary | ICD-10-CM | POA: Diagnosis not present

## 2018-06-08 DIAGNOSIS — M9902 Segmental and somatic dysfunction of thoracic region: Secondary | ICD-10-CM | POA: Diagnosis not present

## 2018-06-08 DIAGNOSIS — M9905 Segmental and somatic dysfunction of pelvic region: Secondary | ICD-10-CM | POA: Diagnosis not present

## 2018-06-10 DIAGNOSIS — M9903 Segmental and somatic dysfunction of lumbar region: Secondary | ICD-10-CM | POA: Diagnosis not present

## 2018-06-10 DIAGNOSIS — M9902 Segmental and somatic dysfunction of thoracic region: Secondary | ICD-10-CM | POA: Diagnosis not present

## 2018-06-10 DIAGNOSIS — M9905 Segmental and somatic dysfunction of pelvic region: Secondary | ICD-10-CM | POA: Diagnosis not present

## 2018-06-13 DIAGNOSIS — M9903 Segmental and somatic dysfunction of lumbar region: Secondary | ICD-10-CM | POA: Diagnosis not present

## 2018-06-13 DIAGNOSIS — M9902 Segmental and somatic dysfunction of thoracic region: Secondary | ICD-10-CM | POA: Diagnosis not present

## 2018-06-13 DIAGNOSIS — M9905 Segmental and somatic dysfunction of pelvic region: Secondary | ICD-10-CM | POA: Diagnosis not present

## 2018-06-15 DIAGNOSIS — M9903 Segmental and somatic dysfunction of lumbar region: Secondary | ICD-10-CM | POA: Diagnosis not present

## 2018-06-15 DIAGNOSIS — M9902 Segmental and somatic dysfunction of thoracic region: Secondary | ICD-10-CM | POA: Diagnosis not present

## 2018-06-15 DIAGNOSIS — M9905 Segmental and somatic dysfunction of pelvic region: Secondary | ICD-10-CM | POA: Diagnosis not present

## 2018-06-17 ENCOUNTER — Other Ambulatory Visit: Payer: 59

## 2018-06-22 DIAGNOSIS — M9903 Segmental and somatic dysfunction of lumbar region: Secondary | ICD-10-CM | POA: Diagnosis not present

## 2018-06-22 DIAGNOSIS — M9902 Segmental and somatic dysfunction of thoracic region: Secondary | ICD-10-CM | POA: Diagnosis not present

## 2018-06-22 DIAGNOSIS — M9905 Segmental and somatic dysfunction of pelvic region: Secondary | ICD-10-CM | POA: Diagnosis not present

## 2018-06-26 DIAGNOSIS — Z23 Encounter for immunization: Secondary | ICD-10-CM | POA: Diagnosis not present

## 2018-06-27 DIAGNOSIS — C441292 Squamous cell carcinoma of skin of left lower eyelid, including canthus: Secondary | ICD-10-CM | POA: Diagnosis not present

## 2018-07-01 DIAGNOSIS — M9903 Segmental and somatic dysfunction of lumbar region: Secondary | ICD-10-CM | POA: Diagnosis not present

## 2018-07-01 DIAGNOSIS — M9905 Segmental and somatic dysfunction of pelvic region: Secondary | ICD-10-CM | POA: Diagnosis not present

## 2018-07-01 DIAGNOSIS — M9902 Segmental and somatic dysfunction of thoracic region: Secondary | ICD-10-CM | POA: Diagnosis not present

## 2018-07-12 DIAGNOSIS — M9905 Segmental and somatic dysfunction of pelvic region: Secondary | ICD-10-CM | POA: Diagnosis not present

## 2018-07-12 DIAGNOSIS — M9902 Segmental and somatic dysfunction of thoracic region: Secondary | ICD-10-CM | POA: Diagnosis not present

## 2018-07-12 DIAGNOSIS — M9903 Segmental and somatic dysfunction of lumbar region: Secondary | ICD-10-CM | POA: Diagnosis not present

## 2018-07-22 DIAGNOSIS — M9902 Segmental and somatic dysfunction of thoracic region: Secondary | ICD-10-CM | POA: Diagnosis not present

## 2018-07-22 DIAGNOSIS — M9905 Segmental and somatic dysfunction of pelvic region: Secondary | ICD-10-CM | POA: Diagnosis not present

## 2018-07-22 DIAGNOSIS — M9903 Segmental and somatic dysfunction of lumbar region: Secondary | ICD-10-CM | POA: Diagnosis not present

## 2018-08-03 ENCOUNTER — Ambulatory Visit
Admission: RE | Admit: 2018-08-03 | Discharge: 2018-08-03 | Disposition: A | Discharge status: Home / Self Care | Payer: 59 | Source: Ambulatory Visit | Attending: Obstetrics and Gynecology | Admitting: Obstetrics and Gynecology

## 2018-08-03 DIAGNOSIS — M8589 Other specified disorders of bone density and structure, multiple sites: Secondary | ICD-10-CM | POA: Diagnosis not present

## 2018-08-03 DIAGNOSIS — Z780 Asymptomatic menopausal state: Secondary | ICD-10-CM | POA: Diagnosis not present

## 2018-08-03 DIAGNOSIS — M810 Age-related osteoporosis without current pathological fracture: Secondary | ICD-10-CM | POA: Diagnosis not present

## 2018-08-05 DIAGNOSIS — M9902 Segmental and somatic dysfunction of thoracic region: Secondary | ICD-10-CM | POA: Diagnosis not present

## 2018-08-05 DIAGNOSIS — M9905 Segmental and somatic dysfunction of pelvic region: Secondary | ICD-10-CM | POA: Diagnosis not present

## 2018-08-05 DIAGNOSIS — M9903 Segmental and somatic dysfunction of lumbar region: Secondary | ICD-10-CM | POA: Diagnosis not present

## 2018-08-16 DIAGNOSIS — L814 Other melanin hyperpigmentation: Secondary | ICD-10-CM | POA: Diagnosis not present

## 2018-08-16 DIAGNOSIS — D225 Melanocytic nevi of trunk: Secondary | ICD-10-CM | POA: Diagnosis not present

## 2018-08-16 DIAGNOSIS — Z85828 Personal history of other malignant neoplasm of skin: Secondary | ICD-10-CM | POA: Diagnosis not present

## 2018-08-25 DIAGNOSIS — Z85850 Personal history of malignant neoplasm of thyroid: Secondary | ICD-10-CM | POA: Diagnosis not present

## 2018-08-25 DIAGNOSIS — J209 Acute bronchitis, unspecified: Secondary | ICD-10-CM | POA: Diagnosis not present

## 2018-10-06 DIAGNOSIS — C73 Malignant neoplasm of thyroid gland: Secondary | ICD-10-CM | POA: Diagnosis not present

## 2018-10-06 DIAGNOSIS — E890 Postprocedural hypothyroidism: Secondary | ICD-10-CM | POA: Diagnosis not present

## 2018-10-26 DIAGNOSIS — R221 Localized swelling, mass and lump, neck: Secondary | ICD-10-CM | POA: Diagnosis not present

## 2018-10-26 DIAGNOSIS — Z85850 Personal history of malignant neoplasm of thyroid: Secondary | ICD-10-CM | POA: Diagnosis not present

## 2018-10-26 DIAGNOSIS — C73 Malignant neoplasm of thyroid gland: Secondary | ICD-10-CM | POA: Diagnosis not present

## 2018-11-03 DIAGNOSIS — R51 Headache: Secondary | ICD-10-CM | POA: Diagnosis not present

## 2018-11-04 ENCOUNTER — Other Ambulatory Visit: Payer: Self-pay | Admitting: Internal Medicine

## 2018-11-04 DIAGNOSIS — R519 Headache, unspecified: Secondary | ICD-10-CM

## 2018-11-04 DIAGNOSIS — R51 Headache: Principal | ICD-10-CM

## 2018-11-13 ENCOUNTER — Ambulatory Visit
Admission: RE | Admit: 2018-11-13 | Discharge: 2018-11-13 | Disposition: A | Discharge status: Home / Self Care | Payer: 59 | Source: Ambulatory Visit | Attending: Internal Medicine | Admitting: Internal Medicine

## 2018-11-13 DIAGNOSIS — R51 Headache: Principal | ICD-10-CM

## 2018-11-13 DIAGNOSIS — R519 Headache, unspecified: Secondary | ICD-10-CM

## 2018-11-13 MED ORDER — GADOBENATE DIMEGLUMINE 529 MG/ML IV SOLN
13.0000 mL | Freq: Once | INTRAVENOUS | Status: AC | PRN
  Administered 2018-11-13: 13 mL via INTRAVENOUS

## 2019-06-13 ENCOUNTER — Ambulatory Visit: Payer: 59 | Admitting: Allergy and Immunology

## 2019-07-04 ENCOUNTER — Other Ambulatory Visit: Payer: Self-pay

## 2019-07-04 ENCOUNTER — Encounter: Payer: Self-pay | Admitting: Allergy and Immunology

## 2019-07-04 ENCOUNTER — Ambulatory Visit (INDEPENDENT_AMBULATORY_CARE_PROVIDER_SITE_OTHER): Payer: 59 | Admitting: Allergy and Immunology

## 2019-07-04 DIAGNOSIS — Z91018 Allergy to other foods: Secondary | ICD-10-CM | POA: Diagnosis not present

## 2019-07-04 DIAGNOSIS — J3089 Other allergic rhinitis: Secondary | ICD-10-CM

## 2019-07-04 DIAGNOSIS — K219 Gastro-esophageal reflux disease without esophagitis: Secondary | ICD-10-CM

## 2019-07-04 DIAGNOSIS — T63441D Toxic effect of venom of bees, accidental (unintentional), subsequent encounter: Secondary | ICD-10-CM

## 2019-07-04 MED ORDER — AUVI-Q 0.3 MG/0.3ML IJ SOAJ: INTRAMUSCULAR | 1 refills | Status: DC

## 2019-07-04 NOTE — Progress Notes (Signed)
Mary Key   Follow-up Note  Referring Provider: Lavone Orn, MD Primary Provider: Lavone Orn, MD Date of Office Visit: 07/04/2019  Subjective:   Mary Key (DOB: 1956-08-03) is a 63 y.o. female who returns to the [facility] on 07/04/2019 in re-evaluation of the following:  HPI: This is a E-med visit requested by patient who is located at home.  Mary Key is followed in this clinic in evaluation of allergic rhinoconjunctivitis, hymenoptera venom hypersensitivity state, reflux, and a history of food allergy directed at kiwi.  Her last visit to this clinic was 25 Jan 2018.  Her airway appears to be doing quite well.  It does not sound as though she has required a systemic steroid or an antibiotic for any type of airway issue.  She uses a nasal steroid on most days of the week.  Overall she believes that this is working quite well at this point in time.  Her reflux is okay while utilizing prilosec intermittently.  She minimizes her consumption of caffeine and chocolate and alcohol.  She still continues to remain away from kiwi consumption.  She does carry an EpiPen because of her hymenoptera venom hypersensitivity state which she is chosen to not have any further evaluation and treatment at this point in time.  Allergies as of 07/04/2019      Reactions   Other Anaphylaxis, Cough   [place] Nuts Other reaction(s): ANAPHYLAXIS Other reaction(s): ANAPHYLAXIS   Almond (diagnostic) Other (See Comments)   Itchy throat.   Bee Venom    Other reaction(s): ANAPHYLAXIS   Kiwi Extract Other (See Comments)   Itchy throat   Singulair [montelukast Sodium] Other (See Comments)   anxiety      Medication List      amphetamine-dextroamphetamine 10 MG tablet Commonly known as: ADDERALL Take 15 mg by mouth 2 (two) times daily. 10 mg in the morning 5 mg midday   Auvi-Q 0.3 mg/0.3 mL Soaj injection Generic drug: EPINEPHrine Use as  directed for life-threatening allergic reaction.   azelastine 0.1 % nasal spray Commonly known as: ASTELIN Place 1 spray into both nostrils 2 (two) times daily. Use in each nostril as directed   buPROPion 150 MG 12 hr tablet Commonly known as: ZYBAN Take 150 mg by mouth every morning.   busPIRone 15 MG tablet Commonly known as: BUSPAR Take 7.5-15 mg by mouth 2 (two) times daily. 15 mg ins the morning and 7.5 mg midday   cholecalciferol 1000 units tablet Commonly known as: VITAMIN D Take 2,000 Units by mouth every morning.   co-enzyme Q-10 30 MG capsule Take 30 mg by mouth 3 (three) times daily.   ESTRACE VAGINAL 0.1 MG/GM vaginal cream Generic drug: estradiol Place 1 Applicatorful vaginally 2 (two) times a week.   FLUoxetine 10 MG tablet Commonly known as: PROZAC Take 5 mg by mouth every morning.   fluticasone 50 MCG/ACT nasal spray Commonly known as: FLONASE Place into both nostrils daily.   ibuprofen 200 MG tablet Commonly known as: ADVIL Take 200 mg by mouth every 6 (six) hours as needed for moderate pain.   KLS ALLERCLEAR PO Take by mouth.   Synthroid 150 MCG tablet Generic drug: levothyroxine   TURMERIC PO Take by mouth daily.   vitamin B-12 1000 MCG tablet Commonly known as: CYANOCOBALAMIN Take by mouth.   zolpidem 5 MG tablet Commonly known as: AMBIEN Take 5 mg by mouth at bedtime as needed for sleep.  Past Medical History:  Diagnosis Date  . ADD (attention deficit disorder)   . Anxiety   . Arthritis   . Depression   . Difficulty sleeping    takes ambien PRN  . GERD (gastroesophageal reflux disease)    eats carefully   . Hx of hepatitis C    cured with medication  . Thyroid cancer [facility])     Past Surgical History:  Procedure Laterality Date  . THYROIDECTOMY N/A 06/13/2015   Procedure: TOTAL THYROIDECTOMY;  Surgeon: Armandina Gemma, MD;  Location: [facility];  Service: General;  Laterality: N/A;  . TONSILLECTOMY      Review of systems  negative except as noted in HPI / PMHx or noted below:  Review of Systems  Constitutional: Negative.   HENT: Negative.   Eyes: Negative.   Respiratory: Negative.   Cardiovascular: Negative.   Gastrointestinal: Negative.   Genitourinary: Negative.   Musculoskeletal: Negative.   Skin: Negative.   Neurological: Negative.   Endo/Heme/Allergies: Negative.   Psychiatric/Behavioral: Negative.      Objective:   There were no vitals filed for this visit.        Physical Exam-deferred  Diagnostics: none  Assessment and Plan:   1. Other allergic rhinitis   2. Bee sting reaction, accidental or unintentional, subsequent encounter   3. Food allergy   4. LPRD (laryngopharyngeal reflux disease)     1. Auvi-Q 0.3, Benadryl, M.D./ER evaluation for allergic reaction  2. Continue OTC Flonase and antihistamine and saline   3. Can treat reflux with the following:   A. consolidate caffeine, chocolate, and alcohol  B. Prilosec 20 mg - 1 tablet 1 time per day if needed  4. Return to clinic in 1 year or earlier if problem.  5. Obtain fall flu vaccine (and COVID vaccine)  Mary Key sounds like she is doing very well on her plan and she will continue to use the plan above and I will see her back in this clinic next year or earlier if needed.  Mary Katz, MD Allergy / Immunology [place]

## 2019-07-04 NOTE — Patient Instructions (Addendum)
  1. Auvi-Q 0.3, Benadryl, M.D./ER evaluation for allergic reaction  2. Continue OTC Flonase and antihistamine and saline   3. Can treat reflux with the following:   A. consolidate caffeine, chocolate, and alcohol  B. Prilosec 20 mg - 1 tablet 1 time per day if needed  4. Return to clinic in 1 year or earlier if problem.  5. Obtain fall flu vaccine (and COVID vaccine)

## 2019-07-05 ENCOUNTER — Encounter: Payer: Self-pay | Admitting: Allergy and Immunology

## 2019-10-22 ENCOUNTER — Ambulatory Visit: Payer: 59

## 2019-11-07 ENCOUNTER — Ambulatory Visit: Payer: 59

## 2020-07-04 DIAGNOSIS — L570 Actinic keratosis: Secondary | ICD-10-CM | POA: Diagnosis not present

## 2020-07-04 DIAGNOSIS — L905 Scar conditions and fibrosis of skin: Secondary | ICD-10-CM | POA: Diagnosis not present

## 2020-07-04 DIAGNOSIS — D225 Melanocytic nevi of trunk: Secondary | ICD-10-CM | POA: Diagnosis not present

## 2020-07-04 DIAGNOSIS — R21 Rash and other nonspecific skin eruption: Secondary | ICD-10-CM | POA: Diagnosis not present

## 2020-07-04 DIAGNOSIS — Z85828 Personal history of other malignant neoplasm of skin: Secondary | ICD-10-CM | POA: Diagnosis not present

## 2020-07-04 DIAGNOSIS — L814 Other melanin hyperpigmentation: Secondary | ICD-10-CM | POA: Diagnosis not present

## 2020-07-04 DIAGNOSIS — D1801 Hemangioma of skin and subcutaneous tissue: Secondary | ICD-10-CM | POA: Diagnosis not present

## 2020-07-04 DIAGNOSIS — L821 Other seborrheic keratosis: Secondary | ICD-10-CM | POA: Diagnosis not present

## 2020-08-01 DIAGNOSIS — N941 Unspecified dyspareunia: Secondary | ICD-10-CM | POA: Diagnosis not present

## 2020-08-15 DIAGNOSIS — Z1231 Encounter for screening mammogram for malignant neoplasm of breast: Secondary | ICD-10-CM | POA: Diagnosis not present

## 2020-08-22 DIAGNOSIS — N958 Other specified menopausal and perimenopausal disorders: Secondary | ICD-10-CM | POA: Diagnosis not present

## 2020-08-22 DIAGNOSIS — M8588 Other specified disorders of bone density and structure, other site: Secondary | ICD-10-CM | POA: Diagnosis not present

## 2020-10-08 DIAGNOSIS — F3342 Major depressive disorder, recurrent, in full remission: Secondary | ICD-10-CM | POA: Diagnosis not present

## 2020-10-08 DIAGNOSIS — F341 Dysthymic disorder: Secondary | ICD-10-CM | POA: Diagnosis not present

## 2020-10-08 DIAGNOSIS — F900 Attention-deficit hyperactivity disorder, predominantly inattentive type: Secondary | ICD-10-CM | POA: Diagnosis not present

## 2020-10-09 ENCOUNTER — Other Ambulatory Visit: Payer: Self-pay | Admitting: Endocrinology

## 2020-10-09 DIAGNOSIS — C73 Malignant neoplasm of thyroid gland: Secondary | ICD-10-CM

## 2020-10-22 ENCOUNTER — Ambulatory Visit
Admission: RE | Admit: 2020-10-22 | Discharge: 2020-10-22 | Disposition: A | Discharge status: Home / Self Care | Payer: Medicare PPO | Source: Ambulatory Visit | Attending: Endocrinology | Admitting: Endocrinology

## 2020-10-22 DIAGNOSIS — C73 Malignant neoplasm of thyroid gland: Secondary | ICD-10-CM

## 2020-10-22 DIAGNOSIS — E890 Postprocedural hypothyroidism: Secondary | ICD-10-CM | POA: Diagnosis not present

## 2020-10-28 DIAGNOSIS — C73 Malignant neoplasm of thyroid gland: Secondary | ICD-10-CM | POA: Diagnosis not present

## 2020-11-15 DIAGNOSIS — C73 Malignant neoplasm of thyroid gland: Secondary | ICD-10-CM | POA: Diagnosis not present

## 2020-11-15 DIAGNOSIS — E890 Postprocedural hypothyroidism: Secondary | ICD-10-CM | POA: Diagnosis not present

## 2021-02-05 DIAGNOSIS — M9902 Segmental and somatic dysfunction of thoracic region: Secondary | ICD-10-CM | POA: Diagnosis not present

## 2021-02-05 DIAGNOSIS — M9905 Segmental and somatic dysfunction of pelvic region: Secondary | ICD-10-CM | POA: Diagnosis not present

## 2021-02-05 DIAGNOSIS — M9901 Segmental and somatic dysfunction of cervical region: Secondary | ICD-10-CM | POA: Diagnosis not present

## 2021-02-05 DIAGNOSIS — M9903 Segmental and somatic dysfunction of lumbar region: Secondary | ICD-10-CM | POA: Diagnosis not present

## 2021-02-05 DIAGNOSIS — F4323 Adjustment disorder with mixed anxiety and depressed mood: Secondary | ICD-10-CM | POA: Diagnosis not present

## 2021-02-12 DIAGNOSIS — M9901 Segmental and somatic dysfunction of cervical region: Secondary | ICD-10-CM | POA: Diagnosis not present

## 2021-02-12 DIAGNOSIS — M9902 Segmental and somatic dysfunction of thoracic region: Secondary | ICD-10-CM | POA: Diagnosis not present

## 2021-02-12 DIAGNOSIS — M9903 Segmental and somatic dysfunction of lumbar region: Secondary | ICD-10-CM | POA: Diagnosis not present

## 2021-02-12 DIAGNOSIS — M9905 Segmental and somatic dysfunction of pelvic region: Secondary | ICD-10-CM | POA: Diagnosis not present

## 2021-03-20 DIAGNOSIS — L570 Actinic keratosis: Secondary | ICD-10-CM | POA: Diagnosis not present

## 2021-03-20 DIAGNOSIS — L814 Other melanin hyperpigmentation: Secondary | ICD-10-CM | POA: Diagnosis not present

## 2021-03-20 DIAGNOSIS — L2084 Intrinsic (allergic) eczema: Secondary | ICD-10-CM | POA: Diagnosis not present

## 2021-03-20 DIAGNOSIS — D1801 Hemangioma of skin and subcutaneous tissue: Secondary | ICD-10-CM | POA: Diagnosis not present

## 2021-03-20 DIAGNOSIS — D225 Melanocytic nevi of trunk: Secondary | ICD-10-CM | POA: Diagnosis not present

## 2021-03-20 DIAGNOSIS — L820 Inflamed seborrheic keratosis: Secondary | ICD-10-CM | POA: Diagnosis not present

## 2021-03-20 DIAGNOSIS — Z85828 Personal history of other malignant neoplasm of skin: Secondary | ICD-10-CM | POA: Diagnosis not present

## 2021-03-20 DIAGNOSIS — L905 Scar conditions and fibrosis of skin: Secondary | ICD-10-CM | POA: Diagnosis not present

## 2021-03-20 DIAGNOSIS — D485 Neoplasm of uncertain behavior of skin: Secondary | ICD-10-CM | POA: Diagnosis not present

## 2021-04-08 DIAGNOSIS — F900 Attention-deficit hyperactivity disorder, predominantly inattentive type: Secondary | ICD-10-CM | POA: Diagnosis not present

## 2021-04-08 DIAGNOSIS — F3342 Major depressive disorder, recurrent, in full remission: Secondary | ICD-10-CM | POA: Diagnosis not present

## 2021-04-08 DIAGNOSIS — F341 Dysthymic disorder: Secondary | ICD-10-CM | POA: Diagnosis not present

## 2021-05-05 DIAGNOSIS — E890 Postprocedural hypothyroidism: Secondary | ICD-10-CM | POA: Diagnosis not present

## 2021-05-05 DIAGNOSIS — C73 Malignant neoplasm of thyroid gland: Secondary | ICD-10-CM | POA: Diagnosis not present

## 2021-05-09 DIAGNOSIS — C73 Malignant neoplasm of thyroid gland: Secondary | ICD-10-CM | POA: Diagnosis not present

## 2021-05-09 DIAGNOSIS — E890 Postprocedural hypothyroidism: Secondary | ICD-10-CM | POA: Diagnosis not present

## 2021-05-15 DIAGNOSIS — E890 Postprocedural hypothyroidism: Secondary | ICD-10-CM | POA: Diagnosis not present

## 2021-05-15 DIAGNOSIS — Z1389 Encounter for screening for other disorder: Secondary | ICD-10-CM | POA: Diagnosis not present

## 2021-05-15 DIAGNOSIS — Z23 Encounter for immunization: Secondary | ICD-10-CM | POA: Diagnosis not present

## 2021-05-15 DIAGNOSIS — M810 Age-related osteoporosis without current pathological fracture: Secondary | ICD-10-CM | POA: Diagnosis not present

## 2021-05-15 DIAGNOSIS — K219 Gastro-esophageal reflux disease without esophagitis: Secondary | ICD-10-CM | POA: Diagnosis not present

## 2021-05-15 DIAGNOSIS — Z0000 Encounter for general adult medical examination without abnormal findings: Secondary | ICD-10-CM | POA: Diagnosis not present

## 2021-05-15 DIAGNOSIS — E7800 Pure hypercholesterolemia, unspecified: Secondary | ICD-10-CM | POA: Diagnosis not present

## 2021-05-15 DIAGNOSIS — G8929 Other chronic pain: Secondary | ICD-10-CM | POA: Diagnosis not present

## 2021-05-15 DIAGNOSIS — F325 Major depressive disorder, single episode, in full remission: Secondary | ICD-10-CM | POA: Diagnosis not present

## 2021-05-15 DIAGNOSIS — M5442 Lumbago with sciatica, left side: Secondary | ICD-10-CM | POA: Diagnosis not present

## 2021-06-25 DIAGNOSIS — M9903 Segmental and somatic dysfunction of lumbar region: Secondary | ICD-10-CM | POA: Diagnosis not present

## 2021-06-25 DIAGNOSIS — M9905 Segmental and somatic dysfunction of pelvic region: Secondary | ICD-10-CM | POA: Diagnosis not present

## 2021-06-25 DIAGNOSIS — M9901 Segmental and somatic dysfunction of cervical region: Secondary | ICD-10-CM | POA: Diagnosis not present

## 2021-06-25 DIAGNOSIS — M9902 Segmental and somatic dysfunction of thoracic region: Secondary | ICD-10-CM | POA: Diagnosis not present

## 2021-07-02 DIAGNOSIS — M9901 Segmental and somatic dysfunction of cervical region: Secondary | ICD-10-CM | POA: Diagnosis not present

## 2021-07-02 DIAGNOSIS — M9903 Segmental and somatic dysfunction of lumbar region: Secondary | ICD-10-CM | POA: Diagnosis not present

## 2021-07-02 DIAGNOSIS — M9902 Segmental and somatic dysfunction of thoracic region: Secondary | ICD-10-CM | POA: Diagnosis not present

## 2021-07-02 DIAGNOSIS — M9905 Segmental and somatic dysfunction of pelvic region: Secondary | ICD-10-CM | POA: Diagnosis not present

## 2021-07-09 DIAGNOSIS — M9901 Segmental and somatic dysfunction of cervical region: Secondary | ICD-10-CM | POA: Diagnosis not present

## 2021-07-09 DIAGNOSIS — M9905 Segmental and somatic dysfunction of pelvic region: Secondary | ICD-10-CM | POA: Diagnosis not present

## 2021-07-09 DIAGNOSIS — M9902 Segmental and somatic dysfunction of thoracic region: Secondary | ICD-10-CM | POA: Diagnosis not present

## 2021-07-09 DIAGNOSIS — M9903 Segmental and somatic dysfunction of lumbar region: Secondary | ICD-10-CM | POA: Diagnosis not present

## 2021-07-14 DIAGNOSIS — E7800 Pure hypercholesterolemia, unspecified: Secondary | ICD-10-CM | POA: Diagnosis not present

## 2021-07-14 DIAGNOSIS — G8929 Other chronic pain: Secondary | ICD-10-CM | POA: Diagnosis not present

## 2021-07-14 DIAGNOSIS — K219 Gastro-esophageal reflux disease without esophagitis: Secondary | ICD-10-CM | POA: Diagnosis not present

## 2021-07-14 DIAGNOSIS — F325 Major depressive disorder, single episode, in full remission: Secondary | ICD-10-CM | POA: Diagnosis not present

## 2021-07-14 DIAGNOSIS — M810 Age-related osteoporosis without current pathological fracture: Secondary | ICD-10-CM | POA: Diagnosis not present

## 2021-07-14 DIAGNOSIS — E890 Postprocedural hypothyroidism: Secondary | ICD-10-CM | POA: Diagnosis not present

## 2021-07-16 DIAGNOSIS — F325 Major depressive disorder, single episode, in full remission: Secondary | ICD-10-CM | POA: Diagnosis not present

## 2021-07-16 DIAGNOSIS — G8929 Other chronic pain: Secondary | ICD-10-CM | POA: Diagnosis not present

## 2021-07-16 DIAGNOSIS — E890 Postprocedural hypothyroidism: Secondary | ICD-10-CM | POA: Diagnosis not present

## 2021-07-16 DIAGNOSIS — M810 Age-related osteoporosis without current pathological fracture: Secondary | ICD-10-CM | POA: Diagnosis not present

## 2021-07-16 DIAGNOSIS — K219 Gastro-esophageal reflux disease without esophagitis: Secondary | ICD-10-CM | POA: Diagnosis not present

## 2021-07-16 DIAGNOSIS — E7800 Pure hypercholesterolemia, unspecified: Secondary | ICD-10-CM | POA: Diagnosis not present

## 2021-07-17 DIAGNOSIS — M9905 Segmental and somatic dysfunction of pelvic region: Secondary | ICD-10-CM | POA: Diagnosis not present

## 2021-07-17 DIAGNOSIS — M9901 Segmental and somatic dysfunction of cervical region: Secondary | ICD-10-CM | POA: Diagnosis not present

## 2021-07-17 DIAGNOSIS — M9902 Segmental and somatic dysfunction of thoracic region: Secondary | ICD-10-CM | POA: Diagnosis not present

## 2021-07-17 DIAGNOSIS — M9903 Segmental and somatic dysfunction of lumbar region: Secondary | ICD-10-CM | POA: Diagnosis not present

## 2021-07-18 DIAGNOSIS — C73 Malignant neoplasm of thyroid gland: Secondary | ICD-10-CM | POA: Diagnosis not present

## 2021-07-18 DIAGNOSIS — E890 Postprocedural hypothyroidism: Secondary | ICD-10-CM | POA: Diagnosis not present

## 2021-07-23 DIAGNOSIS — M9902 Segmental and somatic dysfunction of thoracic region: Secondary | ICD-10-CM | POA: Diagnosis not present

## 2021-07-23 DIAGNOSIS — M9903 Segmental and somatic dysfunction of lumbar region: Secondary | ICD-10-CM | POA: Diagnosis not present

## 2021-07-23 DIAGNOSIS — M9901 Segmental and somatic dysfunction of cervical region: Secondary | ICD-10-CM | POA: Diagnosis not present

## 2021-07-23 DIAGNOSIS — M9905 Segmental and somatic dysfunction of pelvic region: Secondary | ICD-10-CM | POA: Diagnosis not present

## 2021-07-30 DIAGNOSIS — M9902 Segmental and somatic dysfunction of thoracic region: Secondary | ICD-10-CM | POA: Diagnosis not present

## 2021-07-30 DIAGNOSIS — M9903 Segmental and somatic dysfunction of lumbar region: Secondary | ICD-10-CM | POA: Diagnosis not present

## 2021-07-30 DIAGNOSIS — M9901 Segmental and somatic dysfunction of cervical region: Secondary | ICD-10-CM | POA: Diagnosis not present

## 2021-07-30 DIAGNOSIS — M9905 Segmental and somatic dysfunction of pelvic region: Secondary | ICD-10-CM | POA: Diagnosis not present

## 2021-08-05 DIAGNOSIS — M9903 Segmental and somatic dysfunction of lumbar region: Secondary | ICD-10-CM | POA: Diagnosis not present

## 2021-08-05 DIAGNOSIS — M9901 Segmental and somatic dysfunction of cervical region: Secondary | ICD-10-CM | POA: Diagnosis not present

## 2021-08-05 DIAGNOSIS — M9905 Segmental and somatic dysfunction of pelvic region: Secondary | ICD-10-CM | POA: Diagnosis not present

## 2021-08-05 DIAGNOSIS — M9902 Segmental and somatic dysfunction of thoracic region: Secondary | ICD-10-CM | POA: Diagnosis not present

## 2021-08-13 DIAGNOSIS — M9903 Segmental and somatic dysfunction of lumbar region: Secondary | ICD-10-CM | POA: Diagnosis not present

## 2021-08-13 DIAGNOSIS — M9905 Segmental and somatic dysfunction of pelvic region: Secondary | ICD-10-CM | POA: Diagnosis not present

## 2021-08-13 DIAGNOSIS — M9901 Segmental and somatic dysfunction of cervical region: Secondary | ICD-10-CM | POA: Diagnosis not present

## 2021-08-13 DIAGNOSIS — M9902 Segmental and somatic dysfunction of thoracic region: Secondary | ICD-10-CM | POA: Diagnosis not present

## 2021-08-19 DIAGNOSIS — H5213 Myopia, bilateral: Secondary | ICD-10-CM | POA: Diagnosis not present

## 2021-08-19 DIAGNOSIS — H10413 Chronic giant papillary conjunctivitis, bilateral: Secondary | ICD-10-CM | POA: Diagnosis not present

## 2021-08-20 DIAGNOSIS — M9905 Segmental and somatic dysfunction of pelvic region: Secondary | ICD-10-CM | POA: Diagnosis not present

## 2021-08-20 DIAGNOSIS — M9903 Segmental and somatic dysfunction of lumbar region: Secondary | ICD-10-CM | POA: Diagnosis not present

## 2021-08-20 DIAGNOSIS — M9901 Segmental and somatic dysfunction of cervical region: Secondary | ICD-10-CM | POA: Diagnosis not present

## 2021-08-20 DIAGNOSIS — M9902 Segmental and somatic dysfunction of thoracic region: Secondary | ICD-10-CM | POA: Diagnosis not present

## 2021-09-19 DIAGNOSIS — Z124 Encounter for screening for malignant neoplasm of cervix: Secondary | ICD-10-CM | POA: Diagnosis not present

## 2021-09-19 DIAGNOSIS — N952 Postmenopausal atrophic vaginitis: Secondary | ICD-10-CM | POA: Diagnosis not present

## 2021-09-19 DIAGNOSIS — Z1231 Encounter for screening mammogram for malignant neoplasm of breast: Secondary | ICD-10-CM | POA: Diagnosis not present

## 2021-09-19 DIAGNOSIS — Z6822 Body mass index (BMI) 22.0-22.9, adult: Secondary | ICD-10-CM | POA: Diagnosis not present

## 2021-09-23 ENCOUNTER — Other Ambulatory Visit: Payer: Self-pay | Admitting: Obstetrics & Gynecology

## 2021-09-23 DIAGNOSIS — L814 Other melanin hyperpigmentation: Secondary | ICD-10-CM | POA: Diagnosis not present

## 2021-09-23 DIAGNOSIS — L821 Other seborrheic keratosis: Secondary | ICD-10-CM | POA: Diagnosis not present

## 2021-09-23 DIAGNOSIS — D225 Melanocytic nevi of trunk: Secondary | ICD-10-CM | POA: Diagnosis not present

## 2021-09-23 DIAGNOSIS — L570 Actinic keratosis: Secondary | ICD-10-CM | POA: Diagnosis not present

## 2021-09-23 DIAGNOSIS — R928 Other abnormal and inconclusive findings on diagnostic imaging of breast: Secondary | ICD-10-CM

## 2021-09-23 DIAGNOSIS — L2089 Other atopic dermatitis: Secondary | ICD-10-CM | POA: Diagnosis not present

## 2021-09-25 ENCOUNTER — Ambulatory Visit
Admission: RE | Admit: 2021-09-25 | Discharge: 2021-09-25 | Disposition: A | Discharge status: Home / Self Care | Payer: Medicare PPO | Source: Ambulatory Visit | Attending: Obstetrics & Gynecology | Admitting: Obstetrics & Gynecology

## 2021-09-25 ENCOUNTER — Other Ambulatory Visit: Payer: Self-pay

## 2021-09-25 DIAGNOSIS — R928 Other abnormal and inconclusive findings on diagnostic imaging of breast: Secondary | ICD-10-CM | POA: Diagnosis not present

## 2021-09-25 DIAGNOSIS — R922 Inconclusive mammogram: Secondary | ICD-10-CM | POA: Diagnosis not present

## 2021-11-06 DIAGNOSIS — F341 Dysthymic disorder: Secondary | ICD-10-CM | POA: Diagnosis not present

## 2021-11-06 DIAGNOSIS — F900 Attention-deficit hyperactivity disorder, predominantly inattentive type: Secondary | ICD-10-CM | POA: Diagnosis not present

## 2021-11-06 DIAGNOSIS — F3342 Major depressive disorder, recurrent, in full remission: Secondary | ICD-10-CM | POA: Diagnosis not present

## 2021-11-17 DIAGNOSIS — C73 Malignant neoplasm of thyroid gland: Secondary | ICD-10-CM | POA: Diagnosis not present

## 2021-11-17 DIAGNOSIS — E890 Postprocedural hypothyroidism: Secondary | ICD-10-CM | POA: Diagnosis not present

## 2021-11-19 ENCOUNTER — Other Ambulatory Visit: Payer: Self-pay | Admitting: Endocrinology

## 2021-11-19 DIAGNOSIS — E890 Postprocedural hypothyroidism: Secondary | ICD-10-CM

## 2021-11-19 DIAGNOSIS — C73 Malignant neoplasm of thyroid gland: Secondary | ICD-10-CM

## 2021-11-20 ENCOUNTER — Ambulatory Visit
Admission: RE | Admit: 2021-11-20 | Discharge: 2021-11-20 | Disposition: A | Discharge status: Home / Self Care | Payer: Medicare PPO | Source: Ambulatory Visit | Attending: Endocrinology | Admitting: Endocrinology

## 2021-11-20 DIAGNOSIS — E890 Postprocedural hypothyroidism: Secondary | ICD-10-CM

## 2021-11-20 DIAGNOSIS — Z85850 Personal history of malignant neoplasm of thyroid: Secondary | ICD-10-CM | POA: Diagnosis not present

## 2021-11-20 DIAGNOSIS — C73 Malignant neoplasm of thyroid gland: Secondary | ICD-10-CM

## 2021-11-21 DIAGNOSIS — E890 Postprocedural hypothyroidism: Secondary | ICD-10-CM | POA: Diagnosis not present

## 2021-11-25 DIAGNOSIS — M9902 Segmental and somatic dysfunction of thoracic region: Secondary | ICD-10-CM | POA: Diagnosis not present

## 2021-11-25 DIAGNOSIS — M9903 Segmental and somatic dysfunction of lumbar region: Secondary | ICD-10-CM | POA: Diagnosis not present

## 2021-11-25 DIAGNOSIS — M9901 Segmental and somatic dysfunction of cervical region: Secondary | ICD-10-CM | POA: Diagnosis not present

## 2021-11-25 DIAGNOSIS — M9905 Segmental and somatic dysfunction of pelvic region: Secondary | ICD-10-CM | POA: Diagnosis not present

## 2021-12-03 DIAGNOSIS — M9901 Segmental and somatic dysfunction of cervical region: Secondary | ICD-10-CM | POA: Diagnosis not present

## 2021-12-03 DIAGNOSIS — M9905 Segmental and somatic dysfunction of pelvic region: Secondary | ICD-10-CM | POA: Diagnosis not present

## 2021-12-03 DIAGNOSIS — M9902 Segmental and somatic dysfunction of thoracic region: Secondary | ICD-10-CM | POA: Diagnosis not present

## 2021-12-03 DIAGNOSIS — M9903 Segmental and somatic dysfunction of lumbar region: Secondary | ICD-10-CM | POA: Diagnosis not present

## 2022-01-15 DIAGNOSIS — G8929 Other chronic pain: Secondary | ICD-10-CM | POA: Diagnosis not present

## 2022-01-15 DIAGNOSIS — M8580 Other specified disorders of bone density and structure, unspecified site: Secondary | ICD-10-CM | POA: Diagnosis not present

## 2022-01-15 DIAGNOSIS — M542 Cervicalgia: Secondary | ICD-10-CM | POA: Diagnosis not present

## 2022-01-15 DIAGNOSIS — R202 Paresthesia of skin: Secondary | ICD-10-CM | POA: Diagnosis not present

## 2022-01-15 DIAGNOSIS — G479 Sleep disorder, unspecified: Secondary | ICD-10-CM | POA: Diagnosis not present

## 2022-01-15 DIAGNOSIS — E7800 Pure hypercholesterolemia, unspecified: Secondary | ICD-10-CM | POA: Diagnosis not present

## 2022-01-15 DIAGNOSIS — K529 Noninfective gastroenteritis and colitis, unspecified: Secondary | ICD-10-CM | POA: Diagnosis not present

## 2022-01-15 DIAGNOSIS — M5442 Lumbago with sciatica, left side: Secondary | ICD-10-CM | POA: Diagnosis not present

## 2022-01-16 DIAGNOSIS — M9905 Segmental and somatic dysfunction of pelvic region: Secondary | ICD-10-CM | POA: Diagnosis not present

## 2022-01-16 DIAGNOSIS — M9902 Segmental and somatic dysfunction of thoracic region: Secondary | ICD-10-CM | POA: Diagnosis not present

## 2022-01-16 DIAGNOSIS — M9901 Segmental and somatic dysfunction of cervical region: Secondary | ICD-10-CM | POA: Diagnosis not present

## 2022-01-16 DIAGNOSIS — M9903 Segmental and somatic dysfunction of lumbar region: Secondary | ICD-10-CM | POA: Diagnosis not present

## 2022-02-02 DIAGNOSIS — G479 Sleep disorder, unspecified: Secondary | ICD-10-CM | POA: Diagnosis not present

## 2022-02-02 DIAGNOSIS — G8929 Other chronic pain: Secondary | ICD-10-CM | POA: Diagnosis not present

## 2022-02-02 DIAGNOSIS — M542 Cervicalgia: Secondary | ICD-10-CM | POA: Diagnosis not present

## 2022-02-02 DIAGNOSIS — M8580 Other specified disorders of bone density and structure, unspecified site: Secondary | ICD-10-CM | POA: Diagnosis not present

## 2022-02-02 DIAGNOSIS — M5442 Lumbago with sciatica, left side: Secondary | ICD-10-CM | POA: Diagnosis not present

## 2022-02-02 DIAGNOSIS — R202 Paresthesia of skin: Secondary | ICD-10-CM | POA: Diagnosis not present

## 2022-02-02 DIAGNOSIS — E7800 Pure hypercholesterolemia, unspecified: Secondary | ICD-10-CM | POA: Diagnosis not present

## 2022-02-03 DIAGNOSIS — R202 Paresthesia of skin: Secondary | ICD-10-CM | POA: Diagnosis not present

## 2022-02-10 DIAGNOSIS — M542 Cervicalgia: Secondary | ICD-10-CM | POA: Diagnosis not present

## 2022-02-10 DIAGNOSIS — M5442 Lumbago with sciatica, left side: Secondary | ICD-10-CM | POA: Diagnosis not present

## 2022-02-10 DIAGNOSIS — M8580 Other specified disorders of bone density and structure, unspecified site: Secondary | ICD-10-CM | POA: Diagnosis not present

## 2022-02-12 DIAGNOSIS — E8881 Metabolic syndrome: Secondary | ICD-10-CM | POA: Diagnosis not present

## 2022-02-12 DIAGNOSIS — E749 Disorder of carbohydrate metabolism, unspecified: Secondary | ICD-10-CM | POA: Diagnosis not present

## 2022-02-12 DIAGNOSIS — R198 Other specified symptoms and signs involving the digestive system and abdomen: Secondary | ICD-10-CM | POA: Diagnosis not present

## 2022-02-12 DIAGNOSIS — T781XXA Other adverse food reactions, not elsewhere classified, initial encounter: Secondary | ICD-10-CM | POA: Diagnosis not present

## 2022-02-17 DIAGNOSIS — M5442 Lumbago with sciatica, left side: Secondary | ICD-10-CM | POA: Diagnosis not present

## 2022-02-17 DIAGNOSIS — M542 Cervicalgia: Secondary | ICD-10-CM | POA: Diagnosis not present

## 2022-02-17 DIAGNOSIS — M8580 Other specified disorders of bone density and structure, unspecified site: Secondary | ICD-10-CM | POA: Diagnosis not present

## 2022-02-24 DIAGNOSIS — M5442 Lumbago with sciatica, left side: Secondary | ICD-10-CM | POA: Diagnosis not present

## 2022-02-24 DIAGNOSIS — M8580 Other specified disorders of bone density and structure, unspecified site: Secondary | ICD-10-CM | POA: Diagnosis not present

## 2022-02-24 DIAGNOSIS — M542 Cervicalgia: Secondary | ICD-10-CM | POA: Diagnosis not present

## 2022-02-25 DIAGNOSIS — C73 Malignant neoplasm of thyroid gland: Secondary | ICD-10-CM | POA: Diagnosis not present

## 2022-02-25 DIAGNOSIS — T781XXA Other adverse food reactions, not elsewhere classified, initial encounter: Secondary | ICD-10-CM | POA: Diagnosis not present

## 2022-02-25 DIAGNOSIS — E890 Postprocedural hypothyroidism: Secondary | ICD-10-CM | POA: Diagnosis not present

## 2022-02-25 DIAGNOSIS — R198 Other specified symptoms and signs involving the digestive system and abdomen: Secondary | ICD-10-CM | POA: Diagnosis not present

## 2022-02-25 DIAGNOSIS — K529 Noninfective gastroenteritis and colitis, unspecified: Secondary | ICD-10-CM | POA: Diagnosis not present

## 2022-02-27 DIAGNOSIS — M8580 Other specified disorders of bone density and structure, unspecified site: Secondary | ICD-10-CM | POA: Diagnosis not present

## 2022-02-27 DIAGNOSIS — M542 Cervicalgia: Secondary | ICD-10-CM | POA: Diagnosis not present

## 2022-02-27 DIAGNOSIS — M5442 Lumbago with sciatica, left side: Secondary | ICD-10-CM | POA: Diagnosis not present

## 2022-03-16 DIAGNOSIS — M542 Cervicalgia: Secondary | ICD-10-CM | POA: Diagnosis not present

## 2022-03-16 DIAGNOSIS — M5442 Lumbago with sciatica, left side: Secondary | ICD-10-CM | POA: Diagnosis not present

## 2022-03-16 DIAGNOSIS — M8580 Other specified disorders of bone density and structure, unspecified site: Secondary | ICD-10-CM | POA: Diagnosis not present

## 2022-03-24 DIAGNOSIS — M5442 Lumbago with sciatica, left side: Secondary | ICD-10-CM | POA: Diagnosis not present

## 2022-03-24 DIAGNOSIS — M542 Cervicalgia: Secondary | ICD-10-CM | POA: Diagnosis not present

## 2022-03-24 DIAGNOSIS — M8580 Other specified disorders of bone density and structure, unspecified site: Secondary | ICD-10-CM | POA: Diagnosis not present

## 2022-04-01 DIAGNOSIS — M8580 Other specified disorders of bone density and structure, unspecified site: Secondary | ICD-10-CM | POA: Diagnosis not present

## 2022-04-01 DIAGNOSIS — M542 Cervicalgia: Secondary | ICD-10-CM | POA: Diagnosis not present

## 2022-04-01 DIAGNOSIS — M5442 Lumbago with sciatica, left side: Secondary | ICD-10-CM | POA: Diagnosis not present

## 2022-04-08 DIAGNOSIS — M542 Cervicalgia: Secondary | ICD-10-CM | POA: Diagnosis not present

## 2022-04-08 DIAGNOSIS — M5442 Lumbago with sciatica, left side: Secondary | ICD-10-CM | POA: Diagnosis not present

## 2022-04-08 DIAGNOSIS — M8580 Other specified disorders of bone density and structure, unspecified site: Secondary | ICD-10-CM | POA: Diagnosis not present

## 2022-04-10 DIAGNOSIS — E890 Postprocedural hypothyroidism: Secondary | ICD-10-CM | POA: Diagnosis not present

## 2022-04-10 DIAGNOSIS — C73 Malignant neoplasm of thyroid gland: Secondary | ICD-10-CM | POA: Diagnosis not present

## 2022-04-14 DIAGNOSIS — M8580 Other specified disorders of bone density and structure, unspecified site: Secondary | ICD-10-CM | POA: Diagnosis not present

## 2022-04-14 DIAGNOSIS — M5442 Lumbago with sciatica, left side: Secondary | ICD-10-CM | POA: Diagnosis not present

## 2022-04-14 DIAGNOSIS — M542 Cervicalgia: Secondary | ICD-10-CM | POA: Diagnosis not present

## 2022-04-23 DIAGNOSIS — F419 Anxiety disorder, unspecified: Secondary | ICD-10-CM | POA: Diagnosis not present

## 2022-04-23 DIAGNOSIS — M5442 Lumbago with sciatica, left side: Secondary | ICD-10-CM | POA: Diagnosis not present

## 2022-04-23 DIAGNOSIS — M8580 Other specified disorders of bone density and structure, unspecified site: Secondary | ICD-10-CM | POA: Diagnosis not present

## 2022-04-23 DIAGNOSIS — M542 Cervicalgia: Secondary | ICD-10-CM | POA: Diagnosis not present

## 2022-04-23 DIAGNOSIS — G4730 Sleep apnea, unspecified: Secondary | ICD-10-CM | POA: Diagnosis not present

## 2022-04-23 DIAGNOSIS — J3089 Other allergic rhinitis: Secondary | ICD-10-CM | POA: Diagnosis not present

## 2022-04-23 DIAGNOSIS — F458 Other somatoform disorders: Secondary | ICD-10-CM | POA: Diagnosis not present

## 2022-04-23 DIAGNOSIS — G4700 Insomnia, unspecified: Secondary | ICD-10-CM | POA: Diagnosis not present

## 2022-04-25 DIAGNOSIS — G4733 Obstructive sleep apnea (adult) (pediatric): Secondary | ICD-10-CM | POA: Diagnosis not present

## 2022-04-29 DIAGNOSIS — M8580 Other specified disorders of bone density and structure, unspecified site: Secondary | ICD-10-CM | POA: Diagnosis not present

## 2022-04-29 DIAGNOSIS — M542 Cervicalgia: Secondary | ICD-10-CM | POA: Diagnosis not present

## 2022-04-29 DIAGNOSIS — M5442 Lumbago with sciatica, left side: Secondary | ICD-10-CM | POA: Diagnosis not present

## 2022-05-05 DIAGNOSIS — F341 Dysthymic disorder: Secondary | ICD-10-CM | POA: Diagnosis not present

## 2022-05-05 DIAGNOSIS — F3342 Major depressive disorder, recurrent, in full remission: Secondary | ICD-10-CM | POA: Diagnosis not present

## 2022-05-05 DIAGNOSIS — F900 Attention-deficit hyperactivity disorder, predominantly inattentive type: Secondary | ICD-10-CM | POA: Diagnosis not present

## 2022-05-19 DIAGNOSIS — K219 Gastro-esophageal reflux disease without esophagitis: Secondary | ICD-10-CM | POA: Diagnosis not present

## 2022-05-19 DIAGNOSIS — E890 Postprocedural hypothyroidism: Secondary | ICD-10-CM | POA: Diagnosis not present

## 2022-05-19 DIAGNOSIS — Z85850 Personal history of malignant neoplasm of thyroid: Secondary | ICD-10-CM | POA: Diagnosis not present

## 2022-05-19 DIAGNOSIS — K9041 Non-celiac gluten sensitivity: Secondary | ICD-10-CM | POA: Diagnosis not present

## 2022-05-19 DIAGNOSIS — R202 Paresthesia of skin: Secondary | ICD-10-CM | POA: Diagnosis not present

## 2022-05-19 DIAGNOSIS — F325 Major depressive disorder, single episode, in full remission: Secondary | ICD-10-CM | POA: Diagnosis not present

## 2022-05-19 DIAGNOSIS — Z0000 Encounter for general adult medical examination without abnormal findings: Secondary | ICD-10-CM | POA: Diagnosis not present

## 2022-05-19 DIAGNOSIS — M810 Age-related osteoporosis without current pathological fracture: Secondary | ICD-10-CM | POA: Diagnosis not present

## 2022-05-19 DIAGNOSIS — E7800 Pure hypercholesterolemia, unspecified: Secondary | ICD-10-CM | POA: Diagnosis not present

## 2022-05-19 DIAGNOSIS — R140 Abdominal distension (gaseous): Secondary | ICD-10-CM | POA: Diagnosis not present

## 2022-05-26 DIAGNOSIS — M5442 Lumbago with sciatica, left side: Secondary | ICD-10-CM | POA: Diagnosis not present

## 2022-05-26 DIAGNOSIS — M5459 Other low back pain: Secondary | ICD-10-CM | POA: Diagnosis not present

## 2022-05-26 DIAGNOSIS — M8580 Other specified disorders of bone density and structure, unspecified site: Secondary | ICD-10-CM | POA: Diagnosis not present

## 2022-06-09 DIAGNOSIS — M5442 Lumbago with sciatica, left side: Secondary | ICD-10-CM | POA: Diagnosis not present

## 2022-06-09 DIAGNOSIS — M542 Cervicalgia: Secondary | ICD-10-CM | POA: Diagnosis not present

## 2022-06-09 DIAGNOSIS — M8580 Other specified disorders of bone density and structure, unspecified site: Secondary | ICD-10-CM | POA: Diagnosis not present

## 2022-07-14 DIAGNOSIS — G8929 Other chronic pain: Secondary | ICD-10-CM | POA: Diagnosis not present

## 2022-07-14 DIAGNOSIS — M542 Cervicalgia: Secondary | ICD-10-CM | POA: Diagnosis not present

## 2022-07-14 DIAGNOSIS — M8580 Other specified disorders of bone density and structure, unspecified site: Secondary | ICD-10-CM | POA: Diagnosis not present

## 2022-07-14 DIAGNOSIS — R5382 Chronic fatigue, unspecified: Secondary | ICD-10-CM | POA: Diagnosis not present

## 2022-07-14 DIAGNOSIS — Z91018 Allergy to other foods: Secondary | ICD-10-CM | POA: Diagnosis not present

## 2022-07-14 DIAGNOSIS — M5442 Lumbago with sciatica, left side: Secondary | ICD-10-CM | POA: Diagnosis not present

## 2022-07-14 DIAGNOSIS — T781XXA Other adverse food reactions, not elsewhere classified, initial encounter: Secondary | ICD-10-CM | POA: Diagnosis not present

## 2022-07-17 DIAGNOSIS — Z91018 Allergy to other foods: Secondary | ICD-10-CM | POA: Diagnosis not present

## 2022-07-17 DIAGNOSIS — T781XXA Other adverse food reactions, not elsewhere classified, initial encounter: Secondary | ICD-10-CM | POA: Diagnosis not present

## 2022-07-17 DIAGNOSIS — R5382 Chronic fatigue, unspecified: Secondary | ICD-10-CM | POA: Diagnosis not present

## 2022-07-17 DIAGNOSIS — M542 Cervicalgia: Secondary | ICD-10-CM | POA: Diagnosis not present

## 2022-07-17 DIAGNOSIS — G8929 Other chronic pain: Secondary | ICD-10-CM | POA: Diagnosis not present

## 2022-07-23 DIAGNOSIS — L570 Actinic keratosis: Secondary | ICD-10-CM | POA: Diagnosis not present

## 2022-07-23 DIAGNOSIS — R140 Abdominal distension (gaseous): Secondary | ICD-10-CM | POA: Diagnosis not present

## 2022-07-23 DIAGNOSIS — B9681 Helicobacter pylori [H. pylori] as the cause of diseases classified elsewhere: Secondary | ICD-10-CM | POA: Diagnosis not present

## 2022-07-23 DIAGNOSIS — D1801 Hemangioma of skin and subcutaneous tissue: Secondary | ICD-10-CM | POA: Diagnosis not present

## 2022-07-23 DIAGNOSIS — L814 Other melanin hyperpigmentation: Secondary | ICD-10-CM | POA: Diagnosis not present

## 2022-07-23 DIAGNOSIS — R109 Unspecified abdominal pain: Secondary | ICD-10-CM | POA: Diagnosis not present

## 2022-07-23 DIAGNOSIS — L821 Other seborrheic keratosis: Secondary | ICD-10-CM | POA: Diagnosis not present

## 2022-08-03 DIAGNOSIS — Z91018 Allergy to other foods: Secondary | ICD-10-CM | POA: Diagnosis not present

## 2022-08-03 DIAGNOSIS — Z8619 Personal history of other infectious and parasitic diseases: Secondary | ICD-10-CM | POA: Diagnosis not present

## 2022-08-03 DIAGNOSIS — T781XXA Other adverse food reactions, not elsewhere classified, initial encounter: Secondary | ICD-10-CM | POA: Diagnosis not present

## 2022-08-05 DIAGNOSIS — M8580 Other specified disorders of bone density and structure, unspecified site: Secondary | ICD-10-CM | POA: Diagnosis not present

## 2022-08-05 DIAGNOSIS — M5442 Lumbago with sciatica, left side: Secondary | ICD-10-CM | POA: Diagnosis not present

## 2022-08-05 DIAGNOSIS — M542 Cervicalgia: Secondary | ICD-10-CM | POA: Diagnosis not present

## 2022-08-24 DIAGNOSIS — R29890 Loss of height: Secondary | ICD-10-CM | POA: Diagnosis not present

## 2022-08-24 DIAGNOSIS — N958 Other specified menopausal and perimenopausal disorders: Secondary | ICD-10-CM | POA: Diagnosis not present

## 2022-08-24 DIAGNOSIS — M816 Localized osteoporosis [Lequesne]: Secondary | ICD-10-CM | POA: Diagnosis not present

## 2022-08-25 DIAGNOSIS — H52203 Unspecified astigmatism, bilateral: Secondary | ICD-10-CM | POA: Diagnosis not present

## 2022-08-25 DIAGNOSIS — H2513 Age-related nuclear cataract, bilateral: Secondary | ICD-10-CM | POA: Diagnosis not present

## 2022-10-20 ENCOUNTER — Ambulatory Visit: Payer: Medicare PPO | Admitting: Allergy and Immunology

## 2022-10-25 ENCOUNTER — Encounter: Payer: Self-pay | Admitting: Allergy and Immunology

## 2022-10-27 ENCOUNTER — Ambulatory Visit: Payer: Medicare PPO | Admitting: Allergy and Immunology

## 2022-10-27 ENCOUNTER — Encounter: Payer: Self-pay | Admitting: Allergy and Immunology

## 2022-10-27 ENCOUNTER — Other Ambulatory Visit: Payer: Self-pay

## 2022-10-27 VITALS — BP 118/68 | HR 60 | Temp 97.3°F | Resp 16 | Ht 62.75 in | Wt 145.7 lb

## 2022-10-27 DIAGNOSIS — T63481A Toxic effect of venom of other arthropod, accidental (unintentional), initial encounter: Secondary | ICD-10-CM

## 2022-10-27 DIAGNOSIS — T781XXA Other adverse food reactions, not elsewhere classified, initial encounter: Secondary | ICD-10-CM | POA: Diagnosis not present

## 2022-10-27 DIAGNOSIS — T781XXD Other adverse food reactions, not elsewhere classified, subsequent encounter: Secondary | ICD-10-CM

## 2022-10-27 MED ORDER — AUVI-Q 0.3 MG/0.3ML IJ SOAJ: INTRAMUSCULAR | 1 refills | Status: AC

## 2022-10-27 NOTE — Patient Instructions (Addendum)
  1. Food avoidance???  2. Blood - nut component panel, egg component panel, wheat component panel  3. Auvi-Q, benadryl, MD/ER evaluation for allergic reaction

## 2022-10-27 NOTE — Progress Notes (Unsigned)
[place] - [place] - [place] - [place] - [place]   Dear Louis Matte,  Thank you for referring Henchy Foot to the [facility] on 10/27/2022.   Below is a summation of this patient's evaluation and recommendations.  Thank you for your referral. I will keep you informed about this patient's response to treatment.   If you have any questions please do not hesitate to contact me.   Sincerely,  Jiles Prows, MD Allergy / Immunology [place] [place]   ______________________________________________________________________    NEW PATIENT NOTE  Referring Provider: Charlane Ferretti, MD Primary Provider: Charlane Ferretti, MD Date of office visit: 10/27/2022    Subjective:   Chief Complaint:  Mary Key (DOB: 1956-09-06) is a 67 y.o. female who presents to the clinic on 10/27/2022 with a chief complaint of Allergy Testing (Environmental: All except cats/dogs/Food: Wheat, corn, sesame seed, egg white, peanut) and Eczema .     HPI: Lorieann presents to this clinic in evaluation of.  She has been seen in this clinic back in 2020 for an issue with allergic rhinoconjunctivitis, hymenoptera venom hypersensitivity state, reflux, and food allergy directed against kiwi.  She has not really had that much problems with her allergic rhinoconjunctivitis and she does her best to avoid hymenoptera venom hypersensitivity.  She is really here today to discuss some issues that have arisen over the course of the past few years tied up with lack of energy, reflux, oscillating constipation and diarrhea, and food hypersensitivity.  Apparently she has visited with a functional medicine doctor who is diagnosed her with chronic EBV and is placed her on a chronic EBV treatment plan which has helped her regarding her energy.  As well, she has been diagnosed with food hypersensitivity and she presents with 2 different types of laboratory  analysis.  She does have IgE antibodies of relatively low titer directed against egg and peanut and wheat and corn and sesame.  Then she has a list of foods, which appears to have 47 different foods noted on med list, that she has "food hypersensitivity".  Apparently her food hypersensitivity is tied up with fatigue and as well GI issues as well as rashes..  She does have reflux and she does have "churning" and intermittent constipation and diarrhea.  She has eczema on her face and chest for which she uses tacrolimus and topical jak inhibitor a few times per week which appears to result in good control of her skin condition.  She has eliminated dairy and wheat and egg and yeast and bananas and apples and blueberries and citrus and she is eating vegetables and grains and meats and fish and some fruits and coconut and is doing better regarding all these issues.  She is here today to see if she can get some more information about her food hypersensitivity.  Past Medical History:  Diagnosis Date   ADD (attention deficit disorder)    Anxiety    Arthritis    Depression    Difficulty sleeping    takes ambien PRN   GERD (gastroesophageal reflux disease)    eats carefully    Hx of hepatitis C    cured with medication   Thyroid cancer [facility])     Past Surgical History:  Procedure Laterality Date   THYROIDECTOMY N/A 06/13/2015   Procedure: TOTAL THYROIDECTOMY;  Surgeon: Armandina Gemma, MD;  Location: [facility];  Service: General;  Laterality: N/A;  TONSILLECTOMY      Allergies as of 10/27/2022       Reactions   Other Anaphylaxis, Cough   [place] Nuts Other reaction(s): ANAPHYLAXIS Other reaction(s): ANAPHYLAXIS   Almond (diagnostic) Other (See Comments)   Itchy throat.   Bee Venom    Other reaction(s): ANAPHYLAXIS   Kiwi Extract Other (See Comments)   Itchy throat   Singulair [montelukast Sodium] Other (See Comments)   anxiety        Medication List     Auvi-Q 0.3 mg/0.3 mL Soaj  injection Generic drug: EPINEPHrine Use as directed for life-threatening allergic reaction.   cholecalciferol 1000 units tablet Commonly known as: VITAMIN D Take 2,000 Units by mouth every morning.   cyanocobalamin 1000 MCG tablet Commonly known as: VITAMIN B12 Take by mouth.   ESTRACE VAGINAL 0.1 MG/GM vaginal cream Generic drug: estradiol Place 1 Applicatorful vaginally 2 (two) times a week.   Levothyroxine Sodium 100 MCG Caps Take 1 tablet by mouth daily.   MAGNESIUM GLYCINATE PO Take 4 capsules by mouth daily at 12 noon.   NIACINAMIDE ER PO Take 2 capsules by mouth daily. 1000 mg   Opzelura 1.5 % Crea Generic drug: Ruxolitinib Phosphate Apply 1 Application topically as directed.   tacrolimus 0.1 % ointment Commonly known as: PROTOPIC Apply 1 Application topically 2 (two) times daily.   zolpidem 5 MG tablet Commonly known as: AMBIEN Take 5 mg by mouth at bedtime as needed for sleep.    Review of systems negative except as noted in HPI / PMHx or noted below:  Review of Systems  Constitutional: Negative.   HENT: Negative.    Eyes: Negative.   Respiratory: Negative.    Cardiovascular: Negative.   Gastrointestinal: Negative.   Genitourinary: Negative.   Musculoskeletal: Negative.   Skin: Negative.   Neurological: Negative.   Endo/Heme/Allergies: Negative.   Psychiatric/Behavioral: Negative.      Family History  Problem Relation Age of Onset   Thyroid disease Mother    Neuropathy Mother        Peripheral Neuropathy   Cancer - Other Father        Cancer in bladder    Social History   Socioeconomic History   Marital status: Married    Spouse name: Not on file   Number of children: Not on file   Years of education: Not on file   Highest education level: Not on file  Occupational History   Not on file  Tobacco Use   Smoking status: Never   Smokeless tobacco: Never  Substance and Sexual Activity   Alcohol use: Yes    Alcohol/week: 0.0 standard  drinks of alcohol    Comment: 5 beers a week   Drug use: No   Sexual activity: Not on file  Other Topics Concern   Not on file  Social History Narrative   Not on file   Environmental and Social history  Lives in a house with a dry environment, cats located inside the household, no carpet in the bedroom, no plastic on the bed, plastic on the pillow, no smoking ongoing with inside the household.  Objective:   Vitals:   10/27/22 0941  BP: 118/68  Pulse: 60  Resp: 16  Temp: (!) 97.3 F (36.3 C)  SpO2: 99%   Height: 5' 2.75" (159.4 cm) Weight: 145 lb 11.2 oz (66.1 kg)  Physical Exam Constitutional:      Appearance: She is not diaphoretic.  HENT:     Head: Normocephalic.  Right Ear: Tympanic membrane, ear canal and external ear normal.     Left Ear: Tympanic membrane, ear canal and external ear normal.     Nose: Nose normal. No mucosal edema or rhinorrhea.     Mouth/Throat:     Pharynx: Uvula midline. No oropharyngeal exudate.  Eyes:     Conjunctiva/sclera: Conjunctivae normal.  Neck:     Thyroid: No thyromegaly.     Trachea: Trachea normal. No tracheal tenderness or tracheal deviation.  Cardiovascular:     Rate and Rhythm: Normal rate and regular rhythm.     Heart sounds: Normal heart sounds, S1 normal and S2 normal. No murmur heard. Pulmonary:     Effort: No respiratory distress.     Breath sounds: Normal breath sounds. No stridor. No wheezing or rales.  Lymphadenopathy:     Head:     Right side of head: No tonsillar adenopathy.     Left side of head: No tonsillar adenopathy.     Cervical: No cervical adenopathy.  Skin:    Findings: No erythema or rash.     Nails: There is no clubbing.  Neurological:     Mental Status: She is alert.     Diagnostics: Allergy skin tests were performed.   Review of blood tests obtained 17 July 2022 identifies IgE antibodies directed against egg 0.28 KU/L, peanut 0.36 KU/L, wheat 0.14 KU/L, corn 0.12 KU/L, sesame seed 0.30  KU/L.  Results scanned into epic.  Assessment and Plan:    1. Adverse food reaction, subsequent encounter   2. Reaction to hymenoptera sting     Patient Instructions   1. Food avoidance???  2. Blood - nut component panel, egg component panel, wheat component panel  3. Auvi-Q, benadryl, MD/ER evaluation for allergic reaction      Jiles Prows, MD Allergy / Immunology [facility]

## 2022-10-28 ENCOUNTER — Encounter: Payer: Self-pay | Admitting: Allergy and Immunology

## 2022-11-01 LAB — ALLERGENS(7)
Brazil Nut IgE: <0.1 kU/L
F202-IgE Cashew Nut: <0.1 kU/L
Walnut IgE: <0.1 kU/L

## 2022-11-01 LAB — WHEAT IGE W/COMPONENT REFLEX

## 2022-11-03 ENCOUNTER — Ambulatory Visit: Payer: Medicare PPO | Admitting: Allergy and Immunology

## 2022-11-03 ENCOUNTER — Encounter: Payer: Self-pay | Admitting: Allergy and Immunology

## 2022-11-03 ENCOUNTER — Other Ambulatory Visit: Payer: Self-pay

## 2022-11-03 VITALS — BP 112/70 | HR 64 | Temp 98.7°F | Resp 16 | Ht 62.0 in | Wt 143.7 lb

## 2022-11-03 DIAGNOSIS — T781XXD Other adverse food reactions, not elsewhere classified, subsequent encounter: Secondary | ICD-10-CM

## 2022-11-03 LAB — O214-IGE CCD DETERMINANTS: O214-IgE CCD Determinants: <0.1 kU/L

## 2022-11-03 LAB — ALLERGENS(7)
F020-IgE Almond: 0.15 kU/L — AB
Hazelnut (Filbert) IgE: 0.15 kU/L — AB
Peanut IgE: 0.37 kU/L — AB
Pecan Nut IgE: <0.1 kU/L

## 2022-11-03 LAB — EGG COMPONENT PANEL
F232-IgE Ovalbumin: 0.13 kU/L — AB
F233-IgE Ovomucoid: 0.13 kU/L — AB

## 2022-11-03 LAB — WHEAT IGE W/COMPONENT REFLEX
F004-IgE Wheat: 0.12 kU/L — AB
F098-IgE Gliadin, Wheat: <0.1 kU/L

## 2022-11-03 LAB — PANEL 607848: F433-IgE Tri a 14 (nsLTP): <0.1 kU/L

## 2022-11-03 LAB — PANEL 607849: G212-IgE Phl p 12 (Profilin): <0.1 kU/L

## 2022-11-05 ENCOUNTER — Encounter: Payer: Self-pay | Admitting: Allergy and Immunology

## 2022-11-09 ENCOUNTER — Encounter: Payer: Self-pay | Admitting: Allergy and Immunology

## 2022-11-09 NOTE — Progress Notes (Signed)
Mary Key presents to this clinic to discuss her lab tests which show very low levels of IgE to peanut at 0.37 ku/l, hazelnut 0.15 ku/l, almond 0.15 ku/l, ovalbumin 0.13 ku/l, ovomucoid 0.13 ku/l, wheat 0.12 ku/l with negative component reflex.

## 2022-12-15 ENCOUNTER — Ambulatory Visit: Payer: Medicare PPO | Admitting: Allergy and Immunology

## 2023-07-02 ENCOUNTER — Other Ambulatory Visit: Payer: Self-pay | Admitting: Internal Medicine

## 2023-07-02 ENCOUNTER — Ambulatory Visit
Admission: RE | Admit: 2023-07-02 | Discharge: 2023-07-02 | Disposition: A | Discharge status: Home / Self Care | Payer: Medicare PPO | Source: Ambulatory Visit | Attending: Internal Medicine | Admitting: Internal Medicine

## 2023-07-02 DIAGNOSIS — M810 Age-related osteoporosis without current pathological fracture: Secondary | ICD-10-CM

## 2023-07-02 DIAGNOSIS — M549 Dorsalgia, unspecified: Secondary | ICD-10-CM

## 2023-08-30 ENCOUNTER — Encounter (HOSPITAL_BASED_OUTPATIENT_CLINIC_OR_DEPARTMENT_OTHER): Payer: Self-pay | Admitting: Physical Therapy

## 2023-08-30 ENCOUNTER — Other Ambulatory Visit: Payer: Self-pay

## 2023-08-30 ENCOUNTER — Ambulatory Visit (HOSPITAL_BASED_OUTPATIENT_CLINIC_OR_DEPARTMENT_OTHER): Payer: Medicare PPO | Attending: Internal Medicine | Admitting: Physical Therapy

## 2023-08-30 DIAGNOSIS — M6281 Muscle weakness (generalized): Secondary | ICD-10-CM | POA: Insufficient documentation

## 2023-08-30 DIAGNOSIS — M545 Low back pain, unspecified: Secondary | ICD-10-CM | POA: Diagnosis present

## 2023-08-30 NOTE — Therapy (Signed)
OUTPATIENT PHYSICAL THERAPY LOWER EXTREMITY EVALUATION   Patient Name: Mary Key MRN: [id number] DOB:18-Nov-1955, 67 y.o., female Today's Date: 08/30/2023  END OF SESSION:  PT End of Session - 08/30/23 1410     Visit Number 1    Number of Visits 12    Date for PT Re-Evaluation 11/28/23    Authorization Type [employer] MCR    PT Start Time 1400    PT Stop Time 1445    PT Time Calculation (min) 45 min             Past Medical History:  Diagnosis Date   ADD (attention deficit disorder)    Anxiety    Arthritis    Depression    Difficulty sleeping    takes ambien PRN   GERD (gastroesophageal reflux disease)    eats carefully    Hx of hepatitis C    cured with medication   Thyroid cancer [facility])    Past Surgical History:  Procedure Laterality Date   THYROIDECTOMY N/A 06/13/2015   Procedure: TOTAL THYROIDECTOMY;  Surgeon: Darnell Level, MD;  Location: [facility];  Service: General;  Laterality: N/A;   TONSILLECTOMY     Patient Active Problem List   Diagnosis Date Noted   Pelvic mass in female 08/27/2015   Hypocalcemia 06/28/2015   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   Thyroid carcinoma (HCC) 06/13/2015   Papillary thyroid carcinoma (HCC) 06/12/2015    PCP: Thana Ates, MD   REFERRING PROVIDER:  Talmage Coin, MD     REFERRING DIAG: M81.0 (ICD-10-CM) - Age-related osteoporosis without current pathological fracture   THERAPY DIAG:  Pain, lumbar region  Muscle weakness (generalized)  Rationale for Evaluation and Treatment: Rehabilitation  ONSET DATE: 2020  SUBJECTIVE:   SUBJECTIVE STATEMENT: Pt states she is here for largely osteoporosis prevention but does have history of chronic LBP and hip pain. Pt states endocrinologist indicated she has osteoporosis and would like her to work on her bone density. She has been flip-flopping into osteoporosis vs osteopenia. Pt is taking vitamin D and calcium supplementation but has reduced it due to GI issues. Pt had  xrays negative for compression fracture at the lumbar spine.  Pt states she has been very active throughout her life. However, with COVID, she all of a sudden became less active. Her low back started to hurt and then has progressed into the hips over the last year and a half. She has had some aching down the legs. Happens mainly with sleeping and first waking up in the morning. Denies NT. Denies focal weakness.    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 Not currently  PRECAUTIONS: None  RED FLAGS: None   WEIGHT BEARING RESTRICTIONS: No  FALLS:  Has patient fallen in last 6 months? No  LIVING ENVIRONMENT: Lives with: lives with their family and lives with their spouse  OCCUPATION: N/A  PLOF: Independent  PATIENT GOALS: prevent further progression of osteoporosis and back pain    OBJECTIVE:  Note: Objective measures were completed at Evaluation unless otherwise noted.  DIAGNOSTIC FINDINGS:  IMPRESSION: Degenerative changes. No acute osseous abnormalities  PATIENT SURVEYS:  FOTO 61 65 @ DC 3 pts MCII  SCREENING FOR RED FLAGS: Bowel or bladder incontinence: No Spinal tumors: No Cauda  equina syndrome: No Compression fracture: No Abdominal aneurysm: No    POSTURE: rounded shoulders and increased thoracic kyphosis   PALPATION: TTP and significant hypertoncity of bilat R>L lumbar paraspinals, QL, glutes and hip rotators   LUMBAR ROM:    AROM eval  Flexion 80%  Extension 100%  Right lateral flexion 100%  Left lateral flexion 100%  Right rotation 100%  Left rotation 100%   (Blank rows = not tested)   LOWER EXTREMITY ROM:  WFL for tasks assess   LOWER EXTREMITY MMT:  none painful   MMT  Right eval Left eval  Hip flexion 4+/5 4+/5  Hip extension      Hip abduction 4+/5 4+/5  Hip adduction 4+/5 4+/5  Hip internal rotation      Hip external rotation  4+/5 4+/5    (Blank rows = not tested)  GAIT: Distance walked: 13ft Assistive device utilized: None Level of assistance: Complete Independence Comments: WNL   TODAY'S TREATMENT:                                                                                                                              DATE:  12/16   Exercises - Sit to Stand with Resistance Around Legs  - 1 x daily - 2-3 x weekly - 3 sets - 10 reps - Standing Shoulder Row with Anchored Resistance  - 1 x daily - 2-3 x weekly - 3 sets - 10 reps - Deadlift with Resistance  - 1 x daily - 2-3 x weekly - 3 sets - 10 reps - Side Stepping with Resistance at Thighs  - 1 x daily - 2-3 x weekly - 1 sets - 3 reps - 3ft hold - Wall Push Up  - 1 x daily - 2-3 x weekly - 3 sets - 10 reps    PATIENT EDUCATION:  Education details:  diagnosis, prognosis, anatomy,  Wolff's Law, exercise periodization, exercise effects on bone density, exercise progression, DOMS expectations, muscle firing,  envelope of function, HEP, POC  Person educated: Patient Education method: Explanation, Demonstration, Tactile cues, Verbal cues,  Education comprehension: verbalized understanding, returned demonstration, and verbal cues required     HOME EXERCISE PROGRAM:   Access Code: [email or web address] URL: [email or web address] Date: 08/30/2023 Prepared by: Zebedee Iba            ASSESSMENT:   CLINICAL IMPRESSION:   Patient is a 67  y.o. female who was seen today for physical therapy evaluation and treatment for prevention of further osteoporosis and c/c of LBP. In terms of pt's s/s, it appears consistent with mechanical LBP due to mild lumbar stiffness and functional lumbopelvic strength deficits. Thorough edu provided at initial session in regard to exercise's effect on  osteoporosis and gaining of bone density through progressive resistance training and potentially plyometrics. Plan to see pt at reduced frequency in order to help establish self guided gym program that focuses on strength training parameters, functional SL stability, and  low back pain targeted lifting to strength en pelvic girdle and overall bone density. Pt would benefit from continued skilled therapy in order to reach goals and maximize functional lumbopelvic strength and ROM for return to normal mobility and ADL.    OBJECTIVE IMPAIRMENTS decreased activity tolerance, decreased mobility, decreased ROM, decreased strength, postural dysfunction, and pain.    ACTIVITY LIMITATIONS carrying, lifting, squatting, sleeping, and locomotion level   PARTICIPATION LIMITATIONS:  community activity, and exercise   PERSONAL FACTORS Age, Fitness, Time since onset of injury/illness/exacerbation, and 1-2 comorbidities:  are also affecting patient's functional outcome.          GOALS:     SHORT TERM GOALS: Target date: 10/11/2023       Pt will become independent with HEP in order to demonstrate synthesis of PT education.     Goal status: INITIAL     2.  Pt will score at least 3 pt increase on FOTO to demonstrate functional improvement in MCII and pt perceived function.     Goal status: INITIAL     LONG TERM GOALS: Target date: 11/22/2023      Pt  will become independent with final HEP in order to demonstrate synthesis of PT education.    Goal status: INITIAL   2.  Pt will score >/= 65 on FOTO to demonstrate improvement in perceived lumbar function.      Goal status: INITIAL   3.  Pt will be able to lift/squat/hold at least 50-85% of estimated 1RM for multiple sets and reps in order to meet ASCM guideline for appropriate dosage of resistance exercise for maintaining bone health.   Goal status: INITIAL   4.  Pt will be able to demonstrate ability to perform light/moderate modified impact  activity without back pain in order to demonstrate functional improvement in lumbopelvic tolerance to exercise as well as tolerance to recommended exercise for maintaining bone density/health.    Goal status: INITIAL     PLAN:   PLAN: PT FREQUENCY: 1-2x/week   PT DURATION: 12 weeks (plan to D/C in 8 wks)    PLANNED INTERVENTIONS: Therapeutic exercises, Therapeutic activity, Neuromuscular re-education, Balance training, Gait training, Patient/Family education, Joint manipulation, Joint mobilization, Stair training, Orthotic/Fit training, DME instructions, Aquatic Therapy, Dry Needling, Electrical stimulation, Spinal manipulation, Spinal mobilization, Cryotherapy, Moist heat, scar mobilization, Splintting, Taping, Vasopneumatic device, Traction, Ultrasound, Ionotophoresis 4mg /ml Dexamethasone, Manual therapy, and Re-evaluation.   PLAN FOR NEXT SESSION: Progress gym based strength program and begin free weight based exercise for LE strength and lumbopelvic strength; consider heavy farmer's carry, resisted cable walks, deadlifting, lunging, runner's step ups; assess dynamic balance for exercise progression       Zebedee Iba, PT 08/30/2023, 8:55 PM   Referring diagnosis? M81.0 (ICD-10-CM) - Age-related osteoporosis without current pathological fracture  Treatment diagnosis? (if different than referring diagnosis) m62.81, m54.50 What was this (referring dx) caused by? []  Surgery []  Fall [x]  Ongoing issue []  Arthritis [x]  Other: osteopororis   Laterality: []  Rt []  Lt [x]  Both  Check all possible CPT codes:      []  97110 (Therapeutic Exercise)  []  92507 (SLP Treatment)  []  97112 (Neuro Re-ed)   []  92526 (Swallowing Treatment)   []  97116 (Gait Training)   []  [id number] (Cognitive Training, 1st 15 minutes) []  97140 (Manual Therapy)   []  97130 (Cognitive Training, each add'l 15 minutes)  []  97530 (Therapeutic Activities)  []  Other, List CPT Code ____________    []  60454 (Self  Care)       [  x] All codes above (97110 - 97535)  []  97012 (Mechanical Traction)  [x]  97014 (E-stim Unattended)  [x]  97032 (E-stim manual)  [x]  97033 (Ionto)  [x]  97035 (Ultrasound)  [x]  97760 (Orthotic Fit) []  [id number] (Physical Performance Training) [x]  [id number] (Aquatic Therapy) []  [id number] (Contrast Bath) []  [id number] (Paraffin) []  19147 (Wound Care 1st 20 sq cm) []  97598 (Wound Care each add'l 20 sq cm) []  97016 (Vasopneumatic Device) []  [id number] Public affairs consultant) []  [id number] (Prosthetic Training)

## 2023-09-17 ENCOUNTER — Ambulatory Visit (HOSPITAL_BASED_OUTPATIENT_CLINIC_OR_DEPARTMENT_OTHER): Payer: Medicare PPO | Attending: Internal Medicine | Admitting: Physical Therapy

## 2023-09-17 ENCOUNTER — Encounter (HOSPITAL_BASED_OUTPATIENT_CLINIC_OR_DEPARTMENT_OTHER): Payer: Self-pay | Admitting: Physical Therapy

## 2023-09-17 DIAGNOSIS — M6281 Muscle weakness (generalized): Secondary | ICD-10-CM | POA: Diagnosis present

## 2023-09-17 DIAGNOSIS — M545 Low back pain, unspecified: Secondary | ICD-10-CM | POA: Diagnosis present

## 2023-09-17 NOTE — Therapy (Signed)
 OUTPATIENT PHYSICAL THERAPY LOWER EXTREMITY TREATMENT   Patient Name: Mary Key MRN: [id number] DOB:[birth year], 68 y.o., female Today's Date: 09/17/2023  END OF SESSION:  PT End of Session - 09/17/23 0846     Visit Number 2    Number of Visits 12    Date for PT Re-Evaluation 11/28/23    Authorization Type [employer] MCR    PT Start Time 0805    PT Stop Time 0844    PT Time Calculation (min) 39 min    Activity Tolerance Patient tolerated treatment well    Behavior During Therapy WFL for tasks assessed/performed              Past Medical History:  Diagnosis Date   ADD (attention deficit disorder)    Anxiety    Arthritis    Depression    Difficulty sleeping    takes ambien  PRN   GERD (gastroesophageal reflux disease)    eats carefully    Hx of hepatitis C    cured with medication   Thyroid  cancer [facility])    Past Surgical History:  Procedure Laterality Date   THYROIDECTOMY N/A 06/13/2015   Procedure: TOTAL THYROIDECTOMY;  Surgeon: Krystal Spinner, MD;  Location: [facility];  Service: General;  Laterality: N/A;   TONSILLECTOMY     Patient Active Problem List   Diagnosis Date Noted   Pelvic mass in female 08/27/2015   Hypocalcemia 06/28/2015   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   Thyroid  carcinoma (HCC) 06/13/2015   Papillary thyroid  carcinoma (HCC) 06/12/2015    PCP: Dwight Trula SQUIBB, MD   REFERRING PROVIDER:  Faythe Purchase, MD     REFERRING DIAG: M81.0 (ICD-10-CM) - Age-related osteoporosis without current pathological fracture   THERAPY DIAG:  Muscle weakness (generalized)  Pain, lumbar region  Rationale for Evaluation and Treatment: Rehabilitation  ONSET DATE: 2020  SUBJECTIVE:   SUBJECTIVE STATEMENT:  Pt states she has been compliant with HEP. She has intermittent back pain still. She is planning to return to the gym and wants to start gym based exercise.    Eval:  Pt states she is here for largely osteoporosis prevention but does have history of  chronic LBP and hip pain. Pt states endocrinologist indicated she has osteoporosis and would like her to work on her bone density. She has been flip-flopping into osteoporosis vs osteopenia. Pt is taking vitamin D and calcium  supplementation but has reduced it due to GI issues. Pt had xrays negative for compression fracture at the lumbar spine.  Pt states she has been very active throughout her life. However, with COVID, she all of a sudden became less active. Her low back started to hurt and then has progressed into the hips over the last year and a half. She has had some aching down the legs. Happens mainly with sleeping and first waking up in the morning. Denies NT. Denies focal weakness.    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 Not currently  PRECAUTIONS: None  RED FLAGS: None   WEIGHT BEARING RESTRICTIONS: No  FALLS:  Has patient fallen in last 6 months? No  LIVING ENVIRONMENT: Lives with: lives with their family and lives with their spouse  OCCUPATION: N/A  PLOF: Independent  PATIENT GOALS: prevent further progression of osteoporosis  and back pain    OBJECTIVE:  Note: Objective measures were completed at Evaluation unless otherwise noted.  DIAGNOSTIC FINDINGS:  IMPRESSION: Degenerative changes. No acute osseous abnormalities  PATIENT SURVEYS:  FOTO 61 65 @ DC 3 pts MCII  SCREENING FOR RED FLAGS: Bowel or bladder incontinence: No Spinal tumors: No Cauda equina syndrome: No Compression fracture: No Abdominal aneurysm: No    POSTURE: rounded shoulders and increased thoracic kyphosis   PALPATION: TTP and significant hypertoncity of bilat R>L lumbar paraspinals, QL, glutes and hip rotators    LUMBAR ROM:    AROM eval  Flexion 80%  Extension 100%  Right lateral flexion 100%  Left lateral flexion 100%  Right rotation 100%  Left rotation 100%   (Blank rows = not tested)   LOWER EXTREMITY ROM:  WFL for tasks assess   LOWER EXTREMITY MMT:  none painful   MMT Right eval Left eval  Hip flexion 4+/5 4+/5  Hip extension      Hip abduction 4+/5 4+/5  Hip adduction 4+/5 4+/5  Hip internal rotation      Hip external rotation  4+/5 4+/5    (Blank rows = not tested)  GAIT: Distance walked: 43ft Assistive device utilized: None Level of assistance: Complete Independence Comments: WNL   TODAY'S TREATMENT:                                                                                                                              DATE:    1/3  Bike warm up 5 min lvl 4  RPE 7-8 throughout  Program Notes 4x/week if upper/lower body split3x/week if doing total body  Exercises - Sit to Stand with Resistance Around Legs  - 1 x daily - 2-3 x weekly - 3 sets - 10 reps - Side Stepping with Resistance at Thighs  - 1 x daily - 2-3 x weekly - 1 sets - 3 reps - 25ft hold - Bent Over Single Arm Shoulder Row with Dumbbell  - 1 x daily - 2-3 x weekly - 3 sets - 8 reps - Standing Anti-Rotation Press with Anchored Resistance  - 1 x daily - 2-3 x weekly - 2 sets - 10 reps - Farmer's Carry with Kettlebells  - 1 x daily - 2-3 x weekly - 1 sets - 2-3 reps - 30-18ft hold - Full Leg Press  - 1 x daily - 2-3 x weekly - 4 sets - 8 reps - Deadlift with Resistance  - 1 x daily - 2-3 x weekly - 3 sets - 10 reps   12/16   Exercises - Sit to Stand with Resistance Around Legs  - 1 x daily - 2-3 x weekly - 3 sets - 10 reps - Standing Shoulder Row with Anchored Resistance  - 1 x daily - 2-3 x weekly - 3 sets - 10 reps - Deadlift with Resistance  - 1 x daily - 2-3 x weekly - 3 sets -  10 reps - Side Stepping with Resistance at Thighs  - 1 x daily - 2-3 x weekly - 1 sets - 3 reps - 1ft hold -  Wall Push Up  - 1 x daily - 2-3 x weekly - 3 sets - 10 reps    PATIENT EDUCATION:  Education details:  diagnosis, prognosis, anatomy,  Wolff's Law, exercise periodization, exercise effects on bone density, exercise progression, DOMS expectations, muscle firing,  envelope of function, HEP, POC  Person educated: Patient Education method: Explanation, Demonstration, Tactile cues, Verbal cues,  Education comprehension: verbalized understanding, returned demonstration, and verbal cues required     HOME EXERCISE PROGRAM:   Access Code: [email or web address] URL: [email or web address] Date: 08/30/2023 Prepared by: Dale Call            ASSESSMENT:   CLINICAL IMPRESSION:   Pt HEP progressed to gym based movements and higher intensity with RPE 7-8. Pt with good tolerance to new exercise without pain at the low back and able to progress resistance in order to build lumbopelvic capacity as well as bone density to combat OP. Continue with gym progression at next. Plan to discuss cardiovascular exercise as well as creating UE program. Pt would benefit from continued skilled therapy in order to reach goals and maximize functional lumbopelvic strength and ROM for return to normal mobility and ADL.    OBJECTIVE IMPAIRMENTS decreased activity tolerance, decreased mobility, decreased ROM, decreased strength, postural dysfunction, and pain.    ACTIVITY LIMITATIONS carrying, lifting, squatting, sleeping, and locomotion level   PARTICIPATION LIMITATIONS:  community activity, and exercise   PERSONAL FACTORS Age, Fitness, Time since onset of injury/illness/exacerbation, and 1-2 comorbidities:  are also affecting patient's functional outcome.          GOALS:     SHORT TERM GOALS: Target date: 10/11/2023       Pt will become independent with HEP in order to demonstrate synthesis of PT education.     Goal status: INITIAL     2.  Pt will score at least 3 pt increase on FOTO to demonstrate  functional improvement in MCII and pt perceived function.     Goal status: INITIAL     LONG TERM GOALS: Target date: 11/22/2023      Pt  will become independent with final HEP in order to demonstrate synthesis of PT education.    Goal status: INITIAL   2.  Pt will score >/= 65 on FOTO to demonstrate improvement in perceived lumbar function.      Goal status: INITIAL   3.  Pt will be able to lift/squat/hold at least 50-85% of estimated 1RM for multiple sets and reps in order to meet ASCM guideline for appropriate dosage of resistance exercise for maintaining bone health.   Goal status: INITIAL   4.  Pt will be able to demonstrate ability to perform light/moderate modified impact activity without back pain in order to demonstrate functional improvement in lumbopelvic tolerance to exercise as well as tolerance to recommended exercise for maintaining bone density/health.    Goal status: INITIAL     PLAN:   PLAN: PT FREQUENCY: 1-2x/week   PT DURATION: 12 weeks (plan to D/C in 8 wks)    PLANNED INTERVENTIONS: Therapeutic exercises, Therapeutic activity, Neuromuscular re-education, Balance training, Gait training, Patient/Family education, Joint manipulation, Joint mobilization, Stair training, Orthotic/Fit training, DME instructions, Aquatic Therapy, Dry Needling, Electrical stimulation, Spinal manipulation, Spinal mobilization, Cryotherapy, Moist heat, scar mobilization, Splintting, Taping, Vasopneumatic device, Traction, Ultrasound, Ionotophoresis 4mg /ml Dexamethasone , Manual therapy,  and Re-evaluation.   PLAN FOR NEXT SESSION: Progress gym based strength program and begin free weight based exercise for LE strength and lumbopelvic strength; consider heavy farmer's carry, resisted cable walks, deadlifting, lunging, runner's step ups; assess dynamic balance for exercise progression       Dale Call, PT 09/17/2023, 8:48 AM

## 2023-09-28 ENCOUNTER — Ambulatory Visit (HOSPITAL_BASED_OUTPATIENT_CLINIC_OR_DEPARTMENT_OTHER): Payer: Medicare PPO

## 2023-09-28 ENCOUNTER — Encounter (HOSPITAL_BASED_OUTPATIENT_CLINIC_OR_DEPARTMENT_OTHER): Payer: Self-pay

## 2023-09-28 DIAGNOSIS — M545 Low back pain, unspecified: Secondary | ICD-10-CM

## 2023-09-28 DIAGNOSIS — M6281 Muscle weakness (generalized): Secondary | ICD-10-CM

## 2023-09-28 NOTE — Therapy (Signed)
 OUTPATIENT PHYSICAL THERAPY LOWER EXTREMITY TREATMENT   Patient Name: Elanah Osmanovic MRN: [id number] DOB:[birth year], 68 y.o., female Today's Date: 09/28/2023  END OF SESSION:  PT End of Session - 09/28/23 0855     Visit Number 3    Number of Visits 12    Date for PT Re-Evaluation 11/28/23    Authorization Type [employer] MCR    PT Start Time 0855    PT Stop Time 0935    PT Time Calculation (min) 40 min    Activity Tolerance Patient tolerated treatment well    Behavior During Therapy WFL for tasks assessed/performed               Past Medical History:  Diagnosis Date   ADD (attention deficit disorder)    Anxiety    Arthritis    Depression    Difficulty sleeping    takes ambien  PRN   GERD (gastroesophageal reflux disease)    eats carefully    Hx of hepatitis C    cured with medication   Thyroid  cancer [facility])    Past Surgical History:  Procedure Laterality Date   THYROIDECTOMY N/A 06/13/2015   Procedure: TOTAL THYROIDECTOMY;  Surgeon: Krystal Spinner, MD;  Location: [facility];  Service: General;  Laterality: N/A;   TONSILLECTOMY     Patient Active Problem List   Diagnosis Date Noted   Pelvic mass in female 08/27/2015   Hypocalcemia 06/28/2015   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   Thyroid  carcinoma (HCC) 06/13/2015   Papillary thyroid  carcinoma (HCC) 06/12/2015    PCP: Dwight Trula SQUIBB, MD   REFERRING PROVIDER:  Faythe Purchase, MD     REFERRING DIAG: M81.0 (ICD-10-CM) - Age-related osteoporosis without current pathological fracture   THERAPY DIAG:  Muscle weakness (generalized)  Pain, lumbar region  Rationale for Evaluation and Treatment: Rehabilitation  ONSET DATE: 2020  SUBJECTIVE:   SUBJECTIVE STATEMENT:  Pt reports some soreness after last session, but no increase in pain.   Eval:  Pt states she is here for largely osteoporosis prevention but does have history of chronic LBP and hip pain. Pt states endocrinologist indicated she has osteoporosis  and would like her to work on her bone density. She has been flip-flopping into osteoporosis vs osteopenia. Pt is taking vitamin D and calcium  supplementation but has reduced it due to GI issues. Pt had xrays negative for compression fracture at the lumbar spine.  Pt states she has been very active throughout her life. However, with COVID, she all of a sudden became less active. Her low back started to hurt and then has progressed into the hips over the last year and a half. She has had some aching down the legs. Happens mainly with sleeping and first waking up in the morning. Denies NT. Denies focal weakness.    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 Not currently  PRECAUTIONS: None  RED FLAGS: None   WEIGHT BEARING RESTRICTIONS: No  FALLS:  Has patient fallen in last 6 months? No  LIVING ENVIRONMENT: Lives with: lives with their family and lives with their spouse  OCCUPATION: N/A  PLOF: Independent  PATIENT GOALS: prevent further progression of osteoporosis and back pain    OBJECTIVE:  Note: Objective measures were completed at Evaluation unless otherwise  noted.  DIAGNOSTIC FINDINGS:  IMPRESSION: Degenerative changes. No acute osseous abnormalities  PATIENT SURVEYS:  FOTO 61 65 @ DC 3 pts MCII  SCREENING FOR RED FLAGS: Bowel or bladder incontinence: No Spinal tumors: No Cauda equina syndrome: No Compression fracture: No Abdominal aneurysm: No    POSTURE: rounded shoulders and increased thoracic kyphosis   PALPATION: TTP and significant hypertoncity of bilat R>L lumbar paraspinals, QL, glutes and hip rotators   LUMBAR ROM:    AROM eval  Flexion 80%  Extension 100%  Right lateral flexion 100%   Left lateral flexion 100%  Right rotation 100%  Left rotation 100%   (Blank rows = not tested)   LOWER EXTREMITY ROM:  WFL for tasks assess   LOWER EXTREMITY MMT:  none painful   MMT Right eval Left eval  Hip flexion 4+/5 4+/5  Hip extension      Hip abduction 4+/5 4+/5  Hip adduction 4+/5 4+/5  Hip internal rotation      Hip external rotation  4+/5 4+/5    (Blank rows = not tested)  GAIT: Distance walked: 52ft Assistive device utilized: None Level of assistance: Complete Independence Comments: WNL   TODAY'S TREATMENT:                                                                                                                              DATE:    1/14 Bike warm up 5 min L 4  STM to L QL and L lumbar paraspinals Childs pose Sidelying clams GTB 2x15ea Cross legged 3 way lumbar stretch x30sec each Sit to stands with GTB at thighs x10 Staggered STS x10ea Deadlifts-10lb KB x10 Lunges with cues for glute activation x10ea     1/3  Bike warm up 5 min lvl 4  RPE 7-8 throughout  Program Notes 4x/week if upper/lower body split3x/week if doing total body  Exercises - Sit to Stand with Resistance Around Legs  - 1 x daily - 2-3 x weekly - 3 sets - 10 reps - Side Stepping with Resistance at Thighs  - 1 x daily - 2-3 x weekly - 1 sets - 3 reps - 38ft hold - Bent Over Single Arm Shoulder Row with Dumbbell  - 1 x daily - 2-3 x weekly - 3 sets - 8 reps - Standing Anti-Rotation Press with Anchored Resistance  - 1 x daily - 2-3 x weekly - 2 sets - 10 reps - Farmer's Carry with Kettlebells  - 1 x daily - 2-3 x weekly - 1 sets - 2-3 reps - 30-57ft hold - Full Leg Press  - 1 x daily - 2-3 x weekly - 4 sets - 8 reps - Deadlift with Resistance  - 1 x daily - 2-3 x weekly - 3 sets - 10 reps   12/16   Exercises - Sit to Stand with Resistance Around Legs  - 1 x daily - 2-3 x weekly - 3 sets - 10  reps - Standing Shoulder Row with Anchored Resistance  - 1 x daily - 2-3 x  weekly - 3 sets - 10 reps - Deadlift with Resistance  - 1 x daily - 2-3 x weekly - 3 sets - 10 reps - Side Stepping with Resistance at Thighs  - 1 x daily - 2-3 x weekly - 1 sets - 3 reps - 61ft hold - Wall Push Up  - 1 x daily - 2-3 x weekly - 3 sets - 10 reps    PATIENT EDUCATION:  Education details:  diagnosis, prognosis, anatomy,  Wolff's Law, exercise periodization, exercise effects on bone density, exercise progression, DOMS expectations, muscle firing,  envelope of function, HEP, POC  Person educated: Patient Education method: Explanation, Demonstration, Tactile cues, Verbal cues,  Education comprehension: verbalized understanding, returned demonstration, and verbal cues required     HOME EXERCISE PROGRAM:   Access Code: [email or web address] URL: [email or web address] Date: 08/30/2023 Prepared by: Dale Call            ASSESSMENT:   CLINICAL IMPRESSION:   Addressed pt's c/o left sided lumbar tightness today through [facility] and targeted stretching. She did not feel much stretch with child's pose stretch, but felt benefit from cross legged stretching.  Added this to HEP. Doing well with STS, so increased challenge by performing in staggered stance. Also worked on posterior chain strengthening with cues for proper technique. Overall good performance with deadlifts after cues for correct positioning. She did fatigue in lateral hips with s/l clams. Also added this to HEP.    OBJECTIVE IMPAIRMENTS decreased activity tolerance, decreased mobility, decreased ROM, decreased strength, postural dysfunction, and pain.    ACTIVITY LIMITATIONS carrying, lifting, squatting, sleeping, and locomotion level   PARTICIPATION LIMITATIONS:  community activity, and exercise   PERSONAL FACTORS Age, Fitness, Time since onset of injury/illness/exacerbation, and 1-2 comorbidities:  are also affecting patient's functional outcome.          GOALS:     SHORT TERM GOALS: Target date: 10/11/2023        Pt will become independent with HEP in order to demonstrate synthesis of PT education.     Goal status: INITIAL     2.  Pt will score at least 3 pt increase on FOTO to demonstrate functional improvement in MCII and pt perceived function.     Goal status: INITIAL     LONG TERM GOALS: Target date: 11/22/2023      Pt  will become independent with final HEP in order to demonstrate synthesis of PT education.    Goal status: INITIAL   2.  Pt will score >/= 65 on FOTO to demonstrate improvement in perceived lumbar function.      Goal status: INITIAL   3.  Pt will be able to lift/squat/hold at least 50-85% of estimated 1RM for multiple sets and reps in order to meet ASCM guideline for appropriate dosage of resistance exercise for maintaining bone health.   Goal status: INITIAL   4.  Pt will be able to demonstrate ability to perform light/moderate modified impact activity without back pain in order to demonstrate functional improvement in lumbopelvic tolerance to exercise as well as tolerance to recommended exercise for maintaining bone density/health.    Goal status: INITIAL     PLAN:   PLAN: PT FREQUENCY: 1-2x/week   PT DURATION: 12 weeks (plan to D/C in 8 wks)    PLANNED INTERVENTIONS: Therapeutic exercises, Therapeutic activity, Neuromuscular re-education, Balance training, Gait training, Patient/Family education, Joint manipulation,  Joint mobilization, Stair training, Orthotic/Fit training, DME instructions, Aquatic Therapy, Dry Needling, Electrical stimulation, Spinal manipulation, Spinal mobilization, Cryotherapy, Moist heat, scar mobilization, Splintting, Taping, Vasopneumatic device, Traction, Ultrasound, Ionotophoresis 4mg /ml Dexamethasone , Manual therapy, and Re-evaluation.   PLAN FOR NEXT SESSION: Progress gym based strength program and begin free weight based exercise for LE strength and lumbopelvic strength; consider heavy farmer's carry, resisted cable walks,  deadlifting, lunging, runner's step ups; assess dynamic balance for exercise progression       Asberry FORBES Rodes, PTA 09/28/2023, 12:16 PM

## 2023-10-13 ENCOUNTER — Encounter (HOSPITAL_BASED_OUTPATIENT_CLINIC_OR_DEPARTMENT_OTHER): Payer: Self-pay | Admitting: Physical Therapy

## 2023-10-13 ENCOUNTER — Ambulatory Visit (HOSPITAL_BASED_OUTPATIENT_CLINIC_OR_DEPARTMENT_OTHER): Payer: Medicare PPO | Admitting: Physical Therapy

## 2023-10-13 DIAGNOSIS — M6281 Muscle weakness (generalized): Secondary | ICD-10-CM | POA: Diagnosis not present

## 2023-10-13 DIAGNOSIS — M545 Low back pain, unspecified: Secondary | ICD-10-CM

## 2023-10-13 NOTE — Therapy (Signed)
OUTPATIENT PHYSICAL THERAPY LOWER EXTREMITY TREATMENT   Patient Name: Mary Key MRN: [id number] DOB:28-Jul-1956, 68 y.o., female Today's Date: 10/13/2023  END OF SESSION:  PT End of Session - 10/13/23 1614     Visit Number 4    Number of Visits 12    Date for PT Re-Evaluation 11/28/23    Authorization Type [employer] MCR    PT Start Time 1615    PT Stop Time 1700    PT Time Calculation (min) 45 min    Activity Tolerance Patient tolerated treatment well    Behavior During Therapy WFL for tasks assessed/performed               Past Medical History:  Diagnosis Date   ADD (attention deficit disorder)    Anxiety    Arthritis    Depression    Difficulty sleeping    takes ambien PRN   GERD (gastroesophageal reflux disease)    eats carefully    Hx of hepatitis C    cured with medication   Thyroid cancer (HCC)    Past Surgical History:  Procedure Laterality Date   THYROIDECTOMY N/A 06/13/2015   Procedure: TOTAL THYROIDECTOMY;  Surgeon: Darnell Level, MD;  Location: [facility];  Service: General;  Laterality: N/A;   TONSILLECTOMY     Patient Active Problem List   Diagnosis Date Noted   Pelvic mass in female 08/27/2015   Hypocalcemia 06/28/2015   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   Thyroid carcinoma (HCC) 06/13/2015   Papillary thyroid carcinoma (HCC) 06/12/2015    PCP: Thana Ates, MD   REFERRING PROVIDER:  Talmage Coin, MD     REFERRING DIAG: M81.0 (ICD-10-CM) - Age-related osteoporosis without current pathological fracture   THERAPY DIAG:  Muscle weakness (generalized)  Pain, lumbar region  Rationale for Evaluation and Treatment: Rehabilitation  ONSET DATE: 2020  SUBJECTIVE:   SUBJECTIVE STATEMENT:  Pt notes that her back will hurt after the gym. Will go as high as 6/10. Not linked to any particular exercise   Eval:  Pt states she is here for largely osteoporosis prevention but does have history of chronic LBP and hip pain. Pt states  endocrinologist indicated she has osteoporosis and would like her to work on her bone density. She has been flip-flopping into osteoporosis vs osteopenia. Pt is taking vitamin D and calcium supplementation but has reduced it due to GI issues. Pt had xrays negative for compression fracture at the lumbar spine.  Pt states she has been very active throughout her life. However, with COVID, she all of a sudden became less active. Her low back started to hurt and then has progressed into the hips over the last year and a half. She has had some aching down the legs. Happens mainly with sleeping and first waking up in the morning. Denies NT. Denies focal weakness.    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 Not currently  PRECAUTIONS: None  RED FLAGS: None   WEIGHT BEARING RESTRICTIONS: No  FALLS:  Has patient fallen in last 6 months? No  LIVING ENVIRONMENT: Lives with: lives with their family and lives with their spouse  OCCUPATION: N/A  PLOF: Independent  PATIENT GOALS: prevent further progression of osteoporosis and back pain    OBJECTIVE:  Note: Objective measures were completed at Evaluation unless otherwise noted.  DIAGNOSTIC FINDINGS:  IMPRESSION: Degenerative changes. No acute osseous abnormalities  PATIENT SURVEYS:  FOTO 61 65 @ DC 3 pts MCII  SCREENING FOR RED FLAGS: Bowel or bladder incontinence: No Spinal tumors: No Cauda equina syndrome: No Compression fracture: No Abdominal aneurysm: No    POSTURE: rounded shoulders and increased thoracic kyphosis   PALPATION: TTP and significant hypertoncity of bilat R>L lumbar paraspinals, QL, glutes and hip rotators   LUMBAR ROM:    AROM eval  Flexion  80%  Extension 100%  Right lateral flexion 100%  Left lateral flexion 100%  Right rotation 100%  Left rotation 100%   (Blank rows = not tested)   LOWER EXTREMITY ROM:  WFL for tasks assess   LOWER EXTREMITY MMT:  none painful   MMT Right eval Left eval  Hip flexion 4+/5 4+/5  Hip extension      Hip abduction 4+/5 4+/5  Hip adduction 4+/5 4+/5  Hip internal rotation      Hip external rotation  4+/5 4+/5    (Blank rows = not tested)  GAIT: Distance walked: 51ft Assistive device utilized: None Level of assistance: Complete Independence Comments: WNL   TODAY'S TREATMENT:                                                                                                                              DATE:    1/29  STM to L QL and L lumbar paraspinals L UPA L1--5 grade III; CPA L1-5 grade III Cat/cow 12x each way Thread the needle 5x each  Edu: frequency of exercise, recovery (sleep, nutrition, protein intake, stress, etc.), gym progression parameters    1/14 Bike warm up 5 min L 4  STM to L QL and L lumbar paraspinals Childs pose Sidelying clams GTB 2x15ea Cross legged 3 way lumbar stretch x30sec each Sit to stands with GTB at thighs x10 Staggered STS x10ea Deadlifts-10lb KB x10 Lunges with cues for glute activation x10ea     1/3  Bike warm up 5 min lvl 4  RPE 7-8 throughout  Program Notes 4x/week if upper/lower body split3x/week if doing total body  Exercises - Sit to Stand with Resistance Around Legs  - 1 x daily - 2-3 x weekly - 3 sets - 10 reps - Side Stepping with Resistance at Thighs  - 1 x daily - 2-3 x weekly - 1 sets - 3 reps - 58ft hold - Bent Over Single Arm Shoulder Row with Dumbbell  - 1 x daily - 2-3 x weekly - 3 sets - 8 reps - Standing Anti-Rotation Press with Anchored Resistance  - 1 x daily - 2-3 x weekly - 2 sets - 10 reps - Farmer's Carry with Kettlebells  - 1 x daily - 2-3 x weekly - 1 sets - 2-3 reps - 30-78ft hold - Full Leg Press   - 1 x daily -  2-3 x weekly - 4 sets - 8 reps - Deadlift with Resistance  - 1 x daily - 2-3 x weekly - 3 sets - 10 reps   12/16   Exercises - Sit to Stand with Resistance Around Legs  - 1 x daily - 2-3 x weekly - 3 sets - 10 reps - Standing Shoulder Row with Anchored Resistance  - 1 x daily - 2-3 x weekly - 3 sets - 10 reps - Deadlift with Resistance  - 1 x daily - 2-3 x weekly - 3 sets - 10 reps - Side Stepping with Resistance at Thighs  - 1 x daily - 2-3 x weekly - 1 sets - 3 reps - 21ft hold - Wall Push Up  - 1 x daily - 2-3 x weekly - 3 sets - 10 reps    PATIENT EDUCATION:  Education details:  exercise progression, DOMS expectations, muscle firing,  envelope of function, HEP, POC  Person educated: Patient Education method: Explanation, Demonstration, Tactile cues, Verbal cues,  Education comprehension: verbalized understanding, returned demonstration, and verbal cues required     HOME EXERCISE PROGRAM:   Access Code: [email or web address] URL: [email or web address] Date: 08/30/2023 Prepared by: Zebedee Iba            ASSESSMENT:   CLINICAL IMPRESSION:   Session used today to address pt's recurring lumbar pain and stiffness. Pt reports consecutive days of lifting which is likely contributing to pain as well as lack of proper recovery. Pt had improvement in lumbar motion following manual and stretching today. Edu provided and verbal understanding given about proper rest, recovery, and nutrition needed between resistance training sessions as well as use of active recovery days during "rest" days to aid in this. Plan for UE exercise list to be prescribed at next session so pt may alternate between upper and lower splits. Pain management and manual PRN as well.    OBJECTIVE IMPAIRMENTS decreased activity tolerance, decreased mobility, decreased ROM, decreased strength, postural dysfunction, and pain.    ACTIVITY LIMITATIONS carrying, lifting, squatting, sleeping, and locomotion  level   PARTICIPATION LIMITATIONS:  community activity, and exercise   PERSONAL FACTORS Age, Fitness, Time since onset of injury/illness/exacerbation, and 1-2 comorbidities:  are also affecting patient's functional outcome.          GOALS:     SHORT TERM GOALS: Target date: 10/11/2023       Pt will become independent with HEP in order to demonstrate synthesis of PT education.     Goal status: MET     2.  Pt will score at least 3 pt increase on FOTO to demonstrate functional improvement in MCII and pt perceived function.     Goal status: INITIAL     LONG TERM GOALS: Target date: 11/22/2023      Pt  will become independent with final HEP in order to demonstrate synthesis of PT education.    Goal status: INITIAL   2.  Pt will score >/= 65 on FOTO to demonstrate improvement in perceived lumbar function.      Goal status: INITIAL   3.  Pt will be able to lift/squat/hold at least 50-85% of estimated 1RM for multiple sets and reps in order to meet ASCM guideline for appropriate dosage of resistance exercise for maintaining bone health.   Goal status: INITIAL   4.  Pt will be able to demonstrate ability to perform light/moderate modified impact activity without back pain in order to demonstrate functional improvement in lumbopelvic tolerance to  exercise as well as tolerance to recommended exercise for maintaining bone density/health.    Goal status: INITIAL     PLAN:   PLAN: PT FREQUENCY: 1-2x/week   PT DURATION: 12 weeks (plan to D/C in 8 wks)    PLANNED INTERVENTIONS: Therapeutic exercises, Therapeutic activity, Neuromuscular re-education, Balance training, Gait training, Patient/Family education, Joint manipulation, Joint mobilization, Stair training, Orthotic/Fit training, DME instructions, Aquatic Therapy, Dry Needling, Electrical stimulation, Spinal manipulation, Spinal mobilization, Cryotherapy, Moist heat, scar mobilization, Splintting, Taping, Vasopneumatic device,  Traction, Ultrasound, Ionotophoresis 4mg /ml Dexamethasone, Manual therapy, and Re-evaluation.   PLAN FOR NEXT SESSION: Progress gym based strength program and begin free weight based exercise for LE strength and lumbopelvic strength; consider heavy farmer's carry, resisted cable walks, deadlifting, lunging, runner's step ups; assess dynamic balance for exercise progression       Zebedee Iba, PT 10/13/2023, 5:08 PM

## 2023-10-20 ENCOUNTER — Encounter (HOSPITAL_BASED_OUTPATIENT_CLINIC_OR_DEPARTMENT_OTHER): Payer: Medicare PPO | Admitting: Physical Therapy

## 2023-10-27 ENCOUNTER — Encounter (HOSPITAL_BASED_OUTPATIENT_CLINIC_OR_DEPARTMENT_OTHER): Payer: Self-pay | Admitting: Physical Therapy

## 2023-10-27 ENCOUNTER — Ambulatory Visit (HOSPITAL_BASED_OUTPATIENT_CLINIC_OR_DEPARTMENT_OTHER): Payer: Medicare PPO | Attending: Internal Medicine | Admitting: Physical Therapy

## 2023-10-27 DIAGNOSIS — M6281 Muscle weakness (generalized): Secondary | ICD-10-CM | POA: Diagnosis present

## 2023-10-27 DIAGNOSIS — M545 Low back pain, unspecified: Secondary | ICD-10-CM | POA: Insufficient documentation

## 2023-10-27 NOTE — Therapy (Signed)
OUTPATIENT PHYSICAL THERAPY LOWER EXTREMITY TREATMENT   Patient Name: Mary Key MRN: [id number] DOB:01/28/1956, 68 y.o., female Today's Date: 10/27/2023  END OF SESSION:  PT End of Session - 10/27/23 1137     Visit Number 5    Number of Visits 12    Date for PT Re-Evaluation 11/28/23    Authorization Type [employer] MCR    PT Start Time 1022    PT Stop Time 1106    PT Time Calculation (min) 44 min    Activity Tolerance Patient tolerated treatment well    Behavior During Therapy WFL for tasks assessed/performed                Past Medical History:  Diagnosis Date   ADD (attention deficit disorder)    Anxiety    Arthritis    Depression    Difficulty sleeping    takes ambien PRN   GERD (gastroesophageal reflux disease)    eats carefully    Hx of hepatitis C    cured with medication   Thyroid cancer (HCC)    Past Surgical History:  Procedure Laterality Date   THYROIDECTOMY N/A 06/13/2015   Procedure: TOTAL THYROIDECTOMY;  Surgeon: Darnell Level, MD;  Location: [facility];  Service: General;  Laterality: N/A;   TONSILLECTOMY     Patient Active Problem List   Diagnosis Date Noted   Pelvic mass in female 08/27/2015   Hypocalcemia 06/28/2015   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   Thyroid carcinoma (HCC) 06/13/2015   Papillary thyroid carcinoma (HCC) 06/12/2015    PCP: Thana Ates, MD   REFERRING PROVIDER:  Talmage Coin, MD     REFERRING DIAG: M81.0 (ICD-10-CM) - Age-related osteoporosis without current pathological fracture   THERAPY DIAG:  Muscle weakness (generalized)  Pain, lumbar region  Rationale for Evaluation and Treatment: Rehabilitation  ONSET DATE: 2020  SUBJECTIVE:   SUBJECTIVE STATEMENT:  Pt states that she still has some nights of back discomfort/pain that gets up to a 4-5/10. Pt states that reducing frequency does help with pain. Pt states that the aching is in certain places in the lower leg/outsides. Stretching: LTR, cat/cow,  thread the needle, modified pidgeon    Eval:  Pt states she is here for largely osteoporosis prevention but does have history of chronic LBP and hip pain. Pt states endocrinologist indicated she has osteoporosis and would like her to work on her bone density. She has been flip-flopping into osteoporosis vs osteopenia. Pt is taking vitamin D and calcium supplementation but has reduced it due to GI issues. Pt had xrays negative for compression fracture at the lumbar spine.  Pt states she has been very active throughout her life. However, with COVID, she all of a sudden became less active. Her low back started to hurt and then has progressed into the hips over the last year and a half. She has had some aching down the legs. Happens mainly with sleeping and first waking up in the morning. Denies NT. Denies focal weakness.    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 Not currently  PRECAUTIONS: None  RED FLAGS: None   WEIGHT BEARING RESTRICTIONS: No  FALLS:  Has patient fallen in last 6 months? No  LIVING ENVIRONMENT: Lives with: lives with their  family and lives with their spouse  OCCUPATION: N/A  PLOF: Independent  PATIENT GOALS: prevent further progression of osteoporosis and back pain    OBJECTIVE:  Note: Objective measures were completed at Evaluation unless otherwise noted.  DIAGNOSTIC FINDINGS:  IMPRESSION: Degenerative changes. No acute osseous abnormalities  PATIENT SURVEYS:  FOTO 61 65 @ DC 3 pts MCII  SCREENING FOR RED FLAGS: Bowel or bladder incontinence: No Spinal tumors: No Cauda equina syndrome: No Compression fracture: No Abdominal aneurysm: No    POSTURE: rounded shoulders and increased  thoracic kyphosis   PALPATION: TTP and significant hypertoncity of bilat R>L lumbar paraspinals, QL, glutes and hip rotators   LUMBAR ROM:    AROM eval  Flexion 80%  Extension 100%  Right lateral flexion 100%  Left lateral flexion 100%  Right rotation 100%  Left rotation 100%   (Blank rows = not tested)   LOWER EXTREMITY ROM:  WFL for tasks assess   LOWER EXTREMITY MMT:  none painful   MMT Right eval Left eval  Hip flexion 4+/5 4+/5  Hip extension      Hip abduction 4+/5 4+/5  Hip adduction 4+/5 4+/5  Hip internal rotation      Hip external rotation  4+/5 4+/5    (Blank rows = not tested)  GAIT: Distance walked: 92ft Assistive device utilized: None Level of assistance: Complete Independence Comments: WNL    TODAY'S TREATMENT:                                                                                                                              DATE:     (-) slump (-) SLR  2/12 - Supine Hamstring Stretch  - 2 x daily - 7 x weekly - 1 sets - 10 reps - 10 hold - Prone Quadriceps Stretch with Strap  - 2 x daily - 7 x weekly - 1 sets - 3 reps - 30 hold - Hip External Rotation Stretch  - 2 x daily - 7 x weekly - 1 sets - 3 reps - 30 hold - Piriformis Mobilization on Foam Roll  - 1 x daily - 7 x weekly - 1 sets - 1 reps - 2-3 min hold - Quadriceps Mobilization with Foam Roll  - 1 x daily - 7 x weekly - 1 sets - 1 reps - 2-3 min hold - Hamstring Mobilization with Foam Roll  - 1 x daily - 7 x weekly - 1 sets - 1 reps - 2-3 min hold  Gym periodization options, recovery/self treatment options  1/29  STM to L QL and L lumbar paraspinals L UPA L1--5 grade III; CPA L1-5 grade III Cat/cow 12x each way Thread the needle 5x each  Edu: frequency of exercise, recovery (sleep, nutrition, protein intake, stress, etc.), gym progression parameters    1/14 Bike warm up 5 min L 4  STM to L QL and L lumbar paraspinals Childs pose Sidelying clams GTB 2x15ea  Cross  legged 3 way lumbar stretch x30sec each Sit to stands with GTB at thighs x10 Staggered STS x10ea Deadlifts-10lb KB x10 Lunges with cues for glute activation x10ea     1/3  Bike warm up 5 min lvl 4  RPE 7-8 throughout  Program Notes 4x/week if upper/lower body split3x/week if doing total body  Exercises - Sit to Stand with Resistance Around Legs  - 1 x daily - 2-3 x weekly - 3 sets - 10 reps - Side Stepping with Resistance at Thighs  - 1 x daily - 2-3 x weekly - 1 sets - 3 reps - 23ft hold - Bent Over Single Arm Shoulder Row with Dumbbell  - 1 x daily - 2-3 x weekly - 3 sets - 8 reps - Standing Anti-Rotation Press with Anchored Resistance  - 1 x daily - 2-3 x weekly - 2 sets - 10 reps - Farmer's Carry with Kettlebells  - 1 x daily - 2-3 x weekly - 1 sets - 2-3 reps - 30-12ft hold - Full Leg Press  - 1 x daily - 2-3 x weekly - 4 sets - 8 reps - Deadlift with Resistance  - 1 x daily - 2-3 x weekly - 3 sets - 10 reps   12/16   Exercises - Sit to Stand with Resistance Around Legs  - 1 x daily - 2-3 x weekly - 3 sets - 10 reps - Standing Shoulder Row with Anchored Resistance  - 1 x daily - 2-3 x weekly - 3 sets - 10 reps - Deadlift with Resistance  - 1 x daily - 2-3 x weekly - 3 sets - 10 reps - Side Stepping with Resistance at Thighs  - 1 x daily - 2-3 x weekly - 1 sets - 3 reps - 46ft hold - Wall Push Up  - 1 x daily - 2-3 x weekly - 3 sets - 10 reps    PATIENT EDUCATION:  Education details:  exercise progression, DOMS expectations, muscle firing,  envelope of function, HEP, POC  Person educated: Patient Education method: Explanation, Demonstration, Tactile cues, Verbal cues,  Education comprehension: verbalized understanding, returned demonstration, and verbal cues required     HOME EXERCISE PROGRAM:   Access Code: [email or web address] URL: [email or web address] Date: 08/30/2023 Prepared by: Zebedee Iba            ASSESSMENT:   CLINICAL IMPRESSION:   Pt reports  of pain appear consistent with muscle tightness and restriction as pt has positive soft tissue length restrictions. Pt improves with soft tissue length with self stretch and STM techniques. HEP updated accordingly. Plan for 1 more f/u visit for potential D/C if pain subsides. If continued pain, plan to continue therapy with LBP management. Does not appear to be radicular type pain based on clinical testing.    OBJECTIVE IMPAIRMENTS decreased activity tolerance, decreased mobility, decreased ROM, decreased strength, postural dysfunction, and pain.    ACTIVITY LIMITATIONS carrying, lifting, squatting, sleeping, and locomotion level   PARTICIPATION LIMITATIONS:  community activity, and exercise   PERSONAL FACTORS Age, Fitness, Time since onset of injury/illness/exacerbation, and 1-2 comorbidities:  are also affecting patient's functional outcome.          GOALS:     SHORT TERM GOALS: Target date: 10/11/2023       Pt will become independent with HEP in order to demonstrate synthesis of PT education.     Goal status: MET     2.  Pt will score at least 3  pt increase on FOTO to demonstrate functional improvement in MCII and pt perceived function.     Goal status: INITIAL     LONG TERM GOALS: Target date: 11/22/2023      Pt  will become independent with final HEP in order to demonstrate synthesis of PT education.    Goal status: INITIAL   2.  Pt will score >/= 65 on FOTO to demonstrate improvement in perceived lumbar function.      Goal status: INITIAL   3.  Pt will be able to lift/squat/hold at least 50-85% of estimated 1RM for multiple sets and reps in order to meet ASCM guideline for appropriate dosage of resistance exercise for maintaining bone health.   Goal status: INITIAL   4.  Pt will be able to demonstrate ability to perform light/moderate modified impact activity without back pain in order to demonstrate functional improvement in lumbopelvic tolerance to exercise as well  as tolerance to recommended exercise for maintaining bone density/health.    Goal status: INITIAL     PLAN:   PLAN: PT FREQUENCY: 1-2x/week   PT DURATION: 12 weeks (plan to D/C in 8 wks)    PLANNED INTERVENTIONS: Therapeutic exercises, Therapeutic activity, Neuromuscular re-education, Balance training, Gait training, Patient/Family education, Joint manipulation, Joint mobilization, Stair training, Orthotic/Fit training, DME instructions, Aquatic Therapy, Dry Needling, Electrical stimulation, Spinal manipulation, Spinal mobilization, Cryotherapy, Moist heat, scar mobilization, Splintting, Taping, Vasopneumatic device, Traction, Ultrasound, Ionotophoresis 4mg /ml Dexamethasone, Manual therapy, and Re-evaluation.   PLAN FOR NEXT SESSION: Progress gym based strength program and begin free weight based exercise for LE strength and lumbopelvic strength; consider heavy farmer's carry, resisted cable walks, deadlifting, lunging, runner's step ups; assess dynamic balance for exercise progression       Zebedee Iba, PT 10/27/2023, 11:38 AM

## 2023-11-16 LAB — HM DEXA SCAN

## 2023-11-22 ENCOUNTER — Encounter (HOSPITAL_BASED_OUTPATIENT_CLINIC_OR_DEPARTMENT_OTHER): Payer: Medicare PPO | Admitting: Physical Therapy

## 2023-11-25 ENCOUNTER — Ambulatory Visit (HOSPITAL_BASED_OUTPATIENT_CLINIC_OR_DEPARTMENT_OTHER): Payer: Medicare PPO | Attending: Internal Medicine | Admitting: Physical Therapy

## 2023-11-25 ENCOUNTER — Encounter (HOSPITAL_BASED_OUTPATIENT_CLINIC_OR_DEPARTMENT_OTHER): Payer: Self-pay | Admitting: Physical Therapy

## 2023-11-25 DIAGNOSIS — M6281 Muscle weakness (generalized): Secondary | ICD-10-CM | POA: Diagnosis present

## 2023-11-25 DIAGNOSIS — M545 Low back pain, unspecified: Secondary | ICD-10-CM | POA: Diagnosis present

## 2023-11-25 NOTE — Therapy (Signed)
 OUTPATIENT PHYSICAL THERAPY LOWER EXTREMITY TREATMENT   Patient Name: Mary Key MRN: [id number] DOB:1956-03-12, 68 y.o., female Today's Date: 11/25/2023  END OF SESSION:  PT End of Session - 11/25/23 1533     Visit Number 6    Number of Visits 12    Date for PT Re-Evaluation 11/28/23    Authorization Type [employer] M[employer]    PT Start Time 1445    PT Stop Time 1530    PT Time Calculation (min) 45 min    Activity Tolerance Patient tolerated treatment well    Behavior During Therapy WFL for tasks assessed/performed                 Past Medical History:  Diagnosis Date   ADD (attention deficit disorder)    Anxiety    Arthritis    Depression    Difficulty sleeping    takes ambien PRN   GERD (gastroesophageal reflux disease)    eats carefully    Hx of hepatitis C    cured with medication   Thyroid cancer (HCC)    Past Surgical History:  Procedure Laterality Date   THYROIDECTOMY N/A 06/13/2015   Procedure: TOTAL THYROIDECTOMY;  Surgeon: Darnell Level, MD;  Location: [facility];  Service: General;  Laterality: N/A;   TONSILLECTOMY     Patient Active Problem List   Diagnosis Date Noted   Pelvic mass in female 08/27/2015   Hypocalcemia 06/28/2015   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   Thyroid carcinoma (HCC) 06/13/2015   Papillary thyroid carcinoma (HCC) 06/12/2015    PCP: Thana Ates, MD   REFERRING PROVIDER:  Talmage Coin, MD     REFERRING DIAG: M81.0 (ICD-10-CM) - Age-related osteoporosis without current pathological fracture   THERAPY DIAG:  Muscle weakness (generalized)  Pain, lumbar region  Rationale for Evaluation and Treatment: Rehabilitation  ONSET DATE: 2020  SUBJECTIVE:   SUBJECTIVE STATEMENT:  Pt reports good and bad days. She had a shoulder "crisis " where she did not hurt the shoulder but had increased pain. Pt states the shoulder was very sore but a strong dull pain that lasted for about a week. Pt does note more popping that is not  painful. Pt did have Dexa scan and had increase in osteoperoris. Pt has neck pain and fingertip NT. Feels almost like a HA that superficial. Pt notes that recovery is between 2 days now. Pt has had periods of back pain relief.    Eval:  Pt states she is here for largely osteoporosis prevention but does have history of chronic LBP and hip pain. Pt states endocrinologist indicated she has osteoporosis and would like her to work on her bone density. She has been flip-flopping into osteoporosis vs osteopenia. Pt is taking vitamin D and calcium supplementation but has reduced it due to GI issues. Pt had xrays negative for compression fracture at the lumbar spine.  Pt states she has been very active throughout her life. However, with COVID, she all of a sudden became less active. Her low back started to hurt and then has progressed into the hips over the last year and a half. She has had some aching down the legs. Happens mainly with sleeping and first waking up in the morning. Denies NT. Denies focal weakness.    Exercise: walking 3-4 miles 3-4x/week; stretch zone, assisted squats; no resistance exercise currently but has worked with [employer] off and on for a few year  Member of [employer] and [employer] available   PERTINENT  HISTORY: Hypothyroidism, anxiety and depression PAIN:  Are you having pain? Not currently  PRECAUTIONS: None  RED FLAGS: None   WEIGHT BEARING RESTRICTIONS: No  FALLS:  Has patient fallen in last 6 months? No  LIVING ENVIRONMENT: Lives with: lives with their family and lives with their spouse  OCCUPATION: N/A  PLOF: Independent  PATIENT GOALS: prevent further progression of osteoporosis and back pain    OBJECTIVE:  Note: Objective measures were completed at Evaluation unless otherwise noted.  DIAGNOSTIC FINDINGS:  IMPRESSION: Degenerative changes. No acute osseous abnormalities  PATIENT SURVEYS:  FOTO 61 65 @ DC 3 pts MCII  SCREENING FOR  RED FLAGS: Bowel or bladder incontinence: No Spinal tumors: No Cauda equina syndrome: No Compression fracture: No Abdominal aneurysm: No    POSTURE: rounded shoulders and increased thoracic kyphosis   PALPATION: TTP and significant hypertoncity of bilat R>L lumbar paraspinals, QL, glutes and hip rotators   LUMBAR ROM:    AROM eval  Flexion 80%  Extension 100%  Right lateral flexion 100%  Left lateral flexion 100%  Right rotation 100%  Left rotation 100%   (Blank rows = not tested)   LOWER EXTREMITY ROM:  WFL for tasks assess   LOWER EXTREMITY MMT:  none painful   MMT Right eval Left eval  Hip flexion 4+/5 4+/5  Hip extension      Hip abduction 4+/5 4+/5  Hip adduction 4+/5 4+/5  Hip internal rotation      Hip external rotation  4+/5 4+/5    (Blank rows = not tested)  GAIT: Distance walked: 54ft Assistive device utilized: None Level of assistance: Complete Independence Comments: WNL    TODAY'S TREATMENT:                                                                                                                              DATE:    3/13   Program Notes 3-4x/week if upper/lower body split2-3x/week if doing total body  Exercises - Sit to Stand with Resistance Around Legs  - 1 x daily - 2-3 x weekly - 3 sets - 10 reps - Side Stepping with Resistance at Thighs  - 1 x daily - 2-3 x weekly - 1 sets - 3 reps - 54ft hold - Standing Anti-Rotation Press with Anchored Resistance  - 1 x daily - 2-3 x weekly - 2 sets - 10 reps - Farmer's Carry with Kettlebells  - 1 x daily - 2-3 x weekly - 1 sets - 2-3 reps - 30-12ft hold - Full Leg Press  - 1 x daily - 2-3 x weekly - 4 sets - 8 reps - Deadlift with Resistance  - 1 x daily - 2-3 x weekly - 3 sets - 10 reps - Bent Over Single Arm Shoulder Row with Dumbbell  - 1 x daily - 2-3 x weekly - 3 sets - 8 reps - Seated Quadratus Lumborum Stretch with Forward Bend  - 1-2 x  daily - 7 x weekly - 2-3 sets - 20-30seconds hold -  Clam with Resistance  - 1 x daily - 7 x weekly - 2 sets - 15 reps - Supine Hamstring Stretch  - 2 x daily - 7 x weekly - 1 sets - 10 reps - 10 hold - Prone Quadriceps Stretch with Strap  - 2 x daily - 7 x weekly - 1 sets - 3 reps - 30 hold - Hip External Rotation Stretch  - 2 x daily - 7 x weekly - 1 sets - 3 reps - 30 hold - Piriformis Mobilization on Foam Roll  - 1 x daily - 7 x weekly - 1 sets - 1 reps - 2-3 min hold - Quadriceps Mobilization with Foam Roll  - 1 x daily - 7 x weekly - 1 sets - 1 reps - 2-3 min hold - Hamstring Mobilization with Foam Roll  - 1 x daily - 7 x weekly - 1 sets - 1 reps - 2-3 min hold - Seated Cervical Retraction  - 5-6 x daily - 7 x weekly - 1 sets - 5-10 reps - Standing Shoulder Horizontal Abduction with Resistance  - 1 x daily - 2-3 x weekly - 3 sets - 10 reps   Exercise modifications, programming options, recovery times, RPE for weight selection  (-) slump (-) SLR  2/12 - Supine Hamstring Stretch  - 2 x daily - 7 x weekly - 1 sets - 10 reps - 10 hold - Prone Quadriceps Stretch with Strap  - 2 x daily - 7 x weekly - 1 sets - 3 reps - 30 hold - Hip External Rotation Stretch  - 2 x daily - 7 x weekly - 1 sets - 3 reps - 30 hold - Piriformis Mobilization on Foam Roll  - 1 x daily - 7 x weekly - 1 sets - 1 reps - 2-3 min hold - Quadriceps Mobilization with Foam Roll  - 1 x daily - 7 x weekly - 1 sets - 1 reps - 2-3 min hold - Hamstring Mobilization with Foam Roll  - 1 x daily - 7 x weekly - 1 sets - 1 reps - 2-3 min hold  Gym periodization options, recovery/self treatment options  1/29  STM to L QL and L lumbar paraspinals L UPA L1--5 grade III; CPA L1-5 grade III Cat/cow 12x each way Thread the needle 5x each  Edu: frequency of exercise, recovery (sleep, nutrition, protein intake, stress, etc.), gym progression parameters    1/14 Bike warm up 5 min L 4  STM to L QL and L lumbar paraspinals Childs pose Sidelying clams GTB 2x15ea Cross legged 3  way lumbar stretch x30sec each Sit to stands with GTB at thighs x10 Staggered STS x10ea Deadlifts-10lb KB x10 Lunges with cues for glute activation x10ea     1/3  Bike warm up 5 min lvl 4  RPE 7-8 throughout  Program Notes 4x/week if upper/lower body split3x/week if doing total body  Exercises - Sit to Stand with Resistance Around Legs  - 1 x daily - 2-3 x weekly - 3 sets - 10 reps - Side Stepping with Resistance at Thighs  - 1 x daily - 2-3 x weekly - 1 sets - 3 reps - 45ft hold - Bent Over Single Arm Shoulder Row with Dumbbell  - 1 x daily - 2-3 x weekly - 3 sets - 8 reps - Standing Anti-Rotation Press with Anchored Resistance  - 1 x  daily - 2-3 x weekly - 2 sets - 10 reps - Farmer's Carry with Kettlebells  - 1 x daily - 2-3 x weekly - 1 sets - 2-3 reps - 30-55ft hold - Full Leg Press  - 1 x daily - 2-3 x weekly - 4 sets - 8 reps - Deadlift with Resistance  - 1 x daily - 2-3 x weekly - 3 sets - 10 reps   12/16   Exercises - Sit to Stand with Resistance Around Legs  - 1 x daily - 2-3 x weekly - 3 sets - 10 reps - Standing Shoulder Row with Anchored Resistance  - 1 x daily - 2-3 x weekly - 3 sets - 10 reps - Deadlift with Resistance  - 1 x daily - 2-3 x weekly - 3 sets - 10 reps - Side Stepping with Resistance at Thighs  - 1 x daily - 2-3 x weekly - 1 sets - 3 reps - 58ft hold - Wall Push Up  - 1 x daily - 2-3 x weekly - 3 sets - 10 reps    PATIENT EDUCATION:  Education details:  exercise progression, DOMS expectations, muscle firing,  envelope of function, HEP, POC  Person educated: Patient Education method: Explanation, Demonstration, Tactile cues, Verbal cues,  Education comprehension: verbalized understanding, returned demonstration, and verbal cues required     HOME EXERCISE PROGRAM:   Access Code: [email or web address] URL: [email or web address] Date: 08/30/2023 Prepared by: Zebedee Iba            ASSESSMENT:   CLINICAL IMPRESSION:   Pt's lumbar pain and  hip pain has improved since last session but has developed upper back and neck pain due to increase in UE WB type activity. HEP discussed with modifications made to weekly programming and undulating intensities to aid in recovery and strengthening. Plan to review the holistic program with the pt and review gym technique to assess for irritability with free weight/barbell activity. Plan for re-assess at next session to check response to pain.    OBJECTIVE IMPAIRMENTS decreased activity tolerance, decreased mobility, decreased ROM, decreased strength, postural dysfunction, and pain.    ACTIVITY LIMITATIONS carrying, lifting, squatting, sleeping, and locomotion level   PARTICIPATION LIMITATIONS:  community activity, and exercise   PERSONAL FACTORS Age, Fitness, Time since onset of injury/illness/exacerbation, and 1-2 comorbidities:  are also affecting patient's functional outcome.          GOALS:     SHORT TERM GOALS: Target date: 10/11/2023       Pt will become independent with HEP in order to demonstrate synthesis of PT education.     Goal status: MET     2.  Pt will score at least 3 pt increase on FOTO to demonstrate functional improvement in MCII and pt perceived function.     Goal status: INITIAL     LONG TERM GOALS: Target date: 11/22/2023      Pt  will become independent with final HEP in order to demonstrate synthesis of PT education.    Goal status: INITIAL   2.  Pt will score >/= 65 on FOTO to demonstrate improvement in perceived lumbar function.      Goal status: INITIAL   3.  Pt will be able to lift/squat/hold at least 50-85% of estimated 1RM for multiple sets and reps in order to meet ASCM guideline for appropriate dosage of resistance exercise for maintaining bone health.   Goal status: INITIAL   4.  Pt will be able to demonstrate  ability to perform light/moderate modified impact activity without back pain in order to demonstrate functional improvement in  lumbopelvic tolerance to exercise as well as tolerance to recommended exercise for maintaining bone density/health.    Goal status: INITIAL     PLAN:   PLAN: PT FREQUENCY: 1-2x/week   PT DURATION: 12 weeks (plan to D/C in 8 wks)    PLANNED INTERVENTIONS: Therapeutic exercises, Therapeutic activity, Neuromuscular re-education, Balance training, Gait training, Patient/Family education, Joint manipulation, Joint mobilization, Stair training, Orthotic/Fit training, DME instructions, Aquatic Therapy, Dry Needling, Electrical stimulation, Spinal manipulation, Spinal mobilization, Cryotherapy, Moist heat, scar mobilization, Splintting, Taping, Vasopneumatic device, Traction, Ultrasound, Ionotophoresis 4mg /ml Dexamethasone, Manual therapy, and Re-evaluation.   PLAN FOR NEXT SESSION: Progress gym based strength program and begin free weight based exercise for LE strength and lumbopelvic strength; consider heavy farmer's carry, resisted cable walks, deadlifting, lunging, runner's step ups; assess dynamic balance for exercise progression       Zebedee Iba, PT 11/25/2023, 4:08 PM

## 2023-12-01 NOTE — Progress Notes (Unsigned)
   Rubin Payor, PhD, LAT, ATC acting as a scribe for Clementeen Graham, MD.  Mary Key is a 68 y.o. female who presents to [facility] at [facility] today for osteoporosis management. Follows a gluten free diet.   DEXA scan (date, T-score): 11/16/23: Spine= -1.9, L-FN= -2.4, R-FN= -2.5 Prior treatment: *** History of Hip, Spine, or Wrist Fx: *** Heart disease or stroke: *** Cancer: yes- thyroid cancer Kidney Disease: *** Gastric/Peptic Ulcer: *** Gastric bypass surgery: *** Severe GERD: *** Hx of seizures: *** Age at Menopause: 68y/o Calcium intake: yes*** Vitamin D intake: yes*** Hormone replacement therapy: yes- estadiol Smoking history: never smoked Alcohol: rarely Exercise: yes*** Major dental work in past year: *** Parents with hip/spine fracture: *** Height loss: yes: 67" to 65.5"  Pertinent review of systems: ***  Relevant historical information: ***   Exam:  There were no vitals taken for this visit. General: Well Developed, well nourished, and in no acute distress.   MSK: ***    Lab and Radiology Results No results found for this or any previous visit (from the past 72 hours). No results found.     Assessment and Plan: 68 y.o. female with ***   PDMP not reviewed this encounter. No orders of the defined types were placed in this encounter.  No orders of the defined types were placed in this encounter.    Discussed warning signs or symptoms. Please see discharge instructions. Patient expresses understanding.   ***

## 2023-12-02 ENCOUNTER — Ambulatory Visit: Admitting: Family Medicine

## 2023-12-02 VITALS — BP 112/78 | HR 65 | Ht 62.0 in

## 2023-12-02 DIAGNOSIS — M810 Age-related osteoporosis without current pathological fracture: Secondary | ICD-10-CM | POA: Insufficient documentation

## 2023-12-02 NOTE — Patient Instructions (Signed)
 Thank you for coming in today.   Check in to [facility].   Recheck DEXA in 2 years.   Let us know if you would like for [place] to check benefits for Prolia injections.

## 2023-12-06 ENCOUNTER — Encounter: Payer: Self-pay | Admitting: Family Medicine

## 2023-12-06 DIAGNOSIS — M810 Age-related osteoporosis without current pathological fracture: Secondary | ICD-10-CM

## 2023-12-06 DIAGNOSIS — C73 Malignant neoplasm of thyroid gland: Secondary | ICD-10-CM

## 2023-12-08 NOTE — Telephone Encounter (Signed)
 Forwarding to Dr. Denyse Amass as Lorain Childes.

## 2023-12-10 ENCOUNTER — Other Ambulatory Visit

## 2023-12-10 DIAGNOSIS — M810 Age-related osteoporosis without current pathological fracture: Secondary | ICD-10-CM

## 2023-12-10 DIAGNOSIS — C73 Malignant neoplasm of thyroid gland: Secondary | ICD-10-CM

## 2023-12-11 LAB — PTH, INTACT AND CALCIUM
Calcium: 9.7 mg/dL (ref 8.6–10.4)
PTH: 42 pg/mL (ref 16–77)

## 2023-12-12 ENCOUNTER — Encounter (HOSPITAL_BASED_OUTPATIENT_CLINIC_OR_DEPARTMENT_OTHER): Payer: Self-pay | Admitting: Physical Therapy

## 2023-12-13 ENCOUNTER — Encounter: Payer: Self-pay | Admitting: Family Medicine

## 2023-12-13 NOTE — Progress Notes (Signed)
Parathyroid hormone and calcium are normal

## 2023-12-14 ENCOUNTER — Telehealth: Payer: Self-pay

## 2023-12-14 NOTE — Telephone Encounter (Signed)
 Order placed for Prolia injection with [facility].

## 2023-12-15 ENCOUNTER — Telehealth: Payer: Self-pay

## 2023-12-15 NOTE — Telephone Encounter (Signed)
 Hello,  Patient will be scheduled as soon as possible.   Auth Submission: APPROVED Site of care: Site of care: [facility] Payer: humana Medication & CPT/J Code(s) submitted: Prolia (Denosumab) [id number] Route of submission (phone, fax, portal): portal Phone # Fax # Auth type: Buy/Bill PB Units/visits requested: 60mg , q19months x 2doses Reference number: [id number] Approval from: 12/14/23 to 09/13/24

## 2023-12-15 NOTE — Telephone Encounter (Signed)
 Key, Mary Sheer, CPhT36 minutes ago (3:18 PM)    Hello,   Patient will be scheduled as soon as possible.    Auth Submission: APPROVED Site of care: Site of care: [facility] Payer: humana Medication & CPT/J Code(s) submitted: Prolia (Denosumab) [id number] Route of submission (phone, fax, portal): portal Phone # Fax # Auth type: Buy/Bill PB Units/visits requested: 60mg , q44months x 2doses Reference number: [id number] Approval from: 12/14/23 to 09/13/24

## 2023-12-16 ENCOUNTER — Other Ambulatory Visit (HOSPITAL_COMMUNITY): Payer: Self-pay | Admitting: Family Medicine

## 2023-12-16 DIAGNOSIS — M810 Age-related osteoporosis without current pathological fracture: Secondary | ICD-10-CM

## 2023-12-17 NOTE — Telephone Encounter (Signed)
 Dr. Denyse Amass is calling pt to discuss concerns about starting on Prolia. See MyChart message.

## 2023-12-17 NOTE — Telephone Encounter (Signed)
 Dr. Denyse Amass, would you like pt to schedule visit to come back and discuss or discuss over the phone?

## 2023-12-17 NOTE — Telephone Encounter (Signed)
 I called Mary Key and we talked about her bones and prolia. She will try Osteostrong and we can try prolia in the future.

## 2023-12-23 ENCOUNTER — Encounter (HOSPITAL_COMMUNITY)

## 2023-12-27 ENCOUNTER — Encounter (HOSPITAL_BASED_OUTPATIENT_CLINIC_OR_DEPARTMENT_OTHER): Payer: Self-pay | Admitting: Physical Therapy

## 2023-12-27 ENCOUNTER — Ambulatory Visit (HOSPITAL_BASED_OUTPATIENT_CLINIC_OR_DEPARTMENT_OTHER): Attending: Internal Medicine | Admitting: Physical Therapy

## 2023-12-27 DIAGNOSIS — M545 Low back pain, unspecified: Secondary | ICD-10-CM | POA: Insufficient documentation

## 2023-12-27 DIAGNOSIS — R293 Abnormal posture: Secondary | ICD-10-CM | POA: Insufficient documentation

## 2023-12-27 DIAGNOSIS — M6281 Muscle weakness (generalized): Secondary | ICD-10-CM | POA: Insufficient documentation

## 2023-12-27 NOTE — Telephone Encounter (Signed)
 Syliva Even, MD     12/17/23 12:10 PM Note I called Mary Key and we talked about her bones and prolia. She will try Osteostrong and we can try prolia in the future.

## 2023-12-27 NOTE — Therapy (Addendum)
 OUTPATIENT PHYSICAL THERAPY LOWER EXTREMITY TREATMENT  Progress Note Reporting Period 10/27/23 to 12/27/23   See note below for Objective Data and Assessment of Progress/Goals.       Patient Name: Mary Key MRN: [id number] DOB:06-21-1956, 68 y.o., female Today's Date: 12/27/2023  END OF SESSION:  PT End of Session - 12/27/23 1618     Visit Number 7    Number of Visits 12    Date for PT Re-Evaluation 03/26/24    Authorization Type [employer] MCR    PT Start Time 1315    PT Stop Time 1400    PT Time Calculation (min) 45 min    Activity Tolerance Patient tolerated treatment well    Behavior During Therapy WFL for tasks assessed/performed                  Past Medical History:  Diagnosis Date   ADD (attention deficit disorder)    Anxiety    Arthritis    Depression    Difficulty sleeping    takes ambien PRN   GERD (gastroesophageal reflux disease)    eats carefully    Hx of hepatitis C    cured with medication   Thyroid cancer (HCC)    Past Surgical History:  Procedure Laterality Date   THYROIDECTOMY N/A 06/13/2015   Procedure: TOTAL THYROIDECTOMY;  Surgeon: Oralee Billow, MD;  Location: [facility];  Service: General;  Laterality: N/A;   TONSILLECTOMY     Patient Active Problem List   Diagnosis Date Noted   Osteoporosis 12/02/2023   Pelvic mass in female 08/27/2015   Hypocalcemia 06/28/2015   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   Thyroid carcinoma (HCC) 06/13/2015   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 She is doing 10 mins 1x/week. Considering [place] as another option but is too far of a drive. Pt is still struggling with recovery from the gym. Still having trouble sleeping and requires advil to reduce pain. Pt is walking 3x/week with 3-4 miles at a time. Pt is also back to gardening. Pt is having bilat UE NT. Pt notes having a very hard time recovery from her workouts.     Pt reports good and bad days. She had a shoulder "crisis " where she did not hurt the shoulder but had increased pain. Pt states the shoulder was very sore but a strong dull pain that lasted for about a week. Pt does note more popping that is not painful. Pt did have Dexa scan and had increase in osteoperoris. Pt has neck pain and fingertip NT. Feels almost like a HA that superficial. Pt notes that recovery is between 2 days now. Pt has had periods of back pain relief.    Eval:  Pt states she is here for largely osteoporosis prevention but does have history of chronic LBP and hip pain. Pt states endocrinologist indicated she has osteoporosis and would like her to work on her bone density. She has been flip-flopping into  osteoporosis vs osteopenia. Pt is taking vitamin D and calcium supplementation but has reduced it due to GI issues. Pt had xrays negative for compression fracture at the lumbar spine.  Pt states she has been very active throughout her life. However, with COVID, she all of a sudden became less active. Her low back started to hurt and then has progressed into the hips over the last year and a half. She has had some aching down the legs. Happens mainly with sleeping and first waking up in the morning. Denies NT. Denies focal weakness.    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 Not currently  PRECAUTIONS: None  RED FLAGS: None   WEIGHT BEARING RESTRICTIONS: No  FALLS:  Has patient fallen in last 6 months? No  LIVING ENVIRONMENT: Lives with: lives with their family and lives with their spouse  OCCUPATION: N/A  PLOF: Independent  PATIENT GOALS: prevent further progression of osteoporosis and back pain    OBJECTIVE:  Note: Objective measures were completed at Evaluation unless otherwise noted.  DIAGNOSTIC FINDINGS:  IMPRESSION: Degenerative changes. No acute osseous abnormalities  PATIENT SURVEYS:  FOTO 61 65 @ DC 3 pts MCII   70% FOTO 4/14    SCREENING FOR RED FLAGS: Bowel or bladder incontinence: No Spinal tumors: No Cauda equina syndrome: No Compression fracture: No Abdominal aneurysm: No    POSTURE: rounded shoulders and increased thoracic kyphosis    LUMBAR ROM:    AROM eval 4/14  Flexion 80% 90%  Extension 100% 100%  Right lateral flexion 100% 100%  Left lateral flexion 100% 100%  Right rotation 100% 100%  Left rotation 100% 100%   (Blank rows = not tested)   LOWER EXTREMITY ROM:  WFL for tasks assess   LOWER EXTREMITY MMT:  none painful   MMT Right eval R 4/14 Left eval L 4/14  Hip flexion 4+/5 5/5 4+/5 5/5  Hip extension        Hip abduction 4+/5 5/5 4+/5 5/5  Hip adduction 4+/5 4+/5 4+/5 4+/5  Hip internal rotation        Hip external rotation  4+/5 4+/5 4+/5  4+/5   (Blank rows = not tested)  GAIT: Distance walked: 70ft Assistive device utilized: None Level of assistance: Complete Independence Comments: WNL    TODAY'S TREATMENT:                                                                                                                              DATE:    4/14   Program Notes 2x/week total body consider massage therapy  Exercises - Sit  to Stand with Resistance Around Legs  - 1 x daily - 2 x weekly -  4 sets - 6 reps - Side Stepping with Resistance at Thighs  - 1 x daily - 2 x weekly - 1 sets - 3 reps - 31ft hold - Standing Anti-Rotation Press with Anchored Resistance  - 1 x daily - 2 x weekly - 2 sets - 10 reps - Farmer's Carry with Kettlebells  - 1 x daily - 2 x weekly - 1 sets - 2-3 reps - 30-63ft hold - Full Leg Press  - 1 x daily - 2 x weekly - 3 sets - 8 reps - Deadlift with Resistance  - 1 x daily - 2-3 x weekly - 3 sets - 8 reps - Bent Over Single Arm Shoulder Row with Dumbbell  - 1 x daily - 2-3 x weekly - 3 sets - 8 reps - Seated Quadratus Lumborum Stretch with Forward Bend  - 1-2 x daily - 7 x weekly - 2-3 sets - 20-30seconds hold - Clam with Resistance  - 1 x daily - 7 x weekly - 3 sets - 8 reps - Supine Hamstring Stretch  - 2 x daily - 7 x weekly - 1 sets - 10 reps - 10 hold - Prone Quadriceps Stretch with Strap  - 2 x daily - 7 x weekly - 1 sets - 3 reps - 30 hold - Hip External Rotation Stretch  - 2 x daily - 7 x weekly - 1 sets - 3 reps - 30 hold - Piriformis Mobilization on Foam Roll  - 1 x daily - 7 x weekly - 1 sets - 1 reps - 2-3 min hold - Quadriceps Mobilization with Foam Roll  - 1 x daily - 7 x weekly - 1 sets - 1 reps - 2-3 min hold - Hamstring Mobilization with Foam Roll  - 1 x daily - 7 x weekly - 1 sets - 1 reps - 2-3 min hold - Seated Cervical Retraction  - 5-6 x daily - 7 x weekly - 1 sets - 5-10 reps - Median Nerve Glide  - 1 x daily - 7 x weekly - 1 sets - 10 reps - 2 hold - Ulnar Nerve Flossing  - 1 x daily - 7 x weekly - 1 sets - 10 reps - 2 hold - Standing Shoulder Horizontal Abduction with Resistance  - 1 x daily - 2-3 x weekly - 3 sets - 8 reps   3/13   Program Notes 3-4x/week if upper/lower body split2-3x/week if doing total body  Exercises - Sit to Stand with Resistance Around Legs  - 1 x daily - 2-3 x weekly - 3 sets - 10 reps - Side Stepping with Resistance at Thighs  - 1 x daily -  2-3 x weekly - 1 sets - 3 reps - 61ft hold - Standing Anti-Rotation Press with Anchored Resistance  - 1 x daily - 2-3 x weekly - 2 sets - 10 reps - Farmer's Carry with Kettlebells  - 1 x daily - 2-3 x weekly - 1 sets - 2-3 reps - 30-59ft hold - Full Leg Press  - 1 x daily - 2-3 x weekly - 4 sets - 8 reps - Deadlift with Resistance  - 1 x daily - 2-3 x weekly - 3 sets - 10 reps - Bent Over Single Arm Shoulder Row with Dumbbell  - 1 x daily - 2-3 x weekly - 3 sets - 8 reps - Seated Quadratus Lumborum Stretch with Forward Bend  - 1-2 x daily - 7 x weekly -  2-3 sets - 20-30seconds hold - Clam with Resistance  - 1 x daily - 7 x weekly - 2 sets - 15 reps - Supine Hamstring Stretch  - 2 x daily - 7 x weekly - 1 sets - 10 reps - 10 hold - Prone Quadriceps Stretch with Strap  - 2 x daily - 7 x weekly - 1 sets - 3 reps - 30 hold - Hip External Rotation Stretch  - 2 x daily - 7 x weekly - 1 sets - 3 reps - 30 hold - Piriformis Mobilization on Foam Roll  - 1 x daily - 7 x weekly - 1 sets - 1 reps - 2-3 min hold - Quadriceps Mobilization with Foam Roll  - 1 x daily - 7 x weekly - 1 sets - 1 reps - 2-3 min hold - Hamstring Mobilization with Foam Roll  - 1 x daily - 7 x weekly - 1 sets - 1 reps - 2-3 min hold - Seated Cervical Retraction  - 5-6 x daily - 7 x weekly - 1 sets - 5-10 reps - Standing Shoulder Horizontal Abduction with Resistance  - 1 x daily - 2-3 x weekly - 3 sets - 10 reps   Exercise modifications, programming options, recovery times, RPE for weight selection  (-) slump (-) SLR  2/12 - Supine Hamstring Stretch  - 2 x daily - 7 x weekly - 1 sets - 10 reps - 10 hold - Prone Quadriceps Stretch with Strap  - 2 x daily - 7 x weekly - 1 sets - 3 reps - 30 hold - Hip External Rotation Stretch  - 2 x daily - 7 x weekly - 1 sets - 3 reps - 30 hold - Piriformis Mobilization on Foam Roll  - 1 x daily - 7 x weekly - 1 sets - 1 reps - 2-3 min hold - Quadriceps Mobilization with Foam Roll  - 1 x daily  - 7 x weekly - 1 sets - 1 reps - 2-3 min hold - Hamstring Mobilization with Foam Roll  - 1 x daily - 7 x weekly - 1 sets - 1 reps - 2-3 min hold  Gym periodization options, recovery/self treatment options  1/29  STM to L QL and L lumbar paraspinals L UPA L1--5 grade III; CPA L1-5 grade III Cat/cow 12x each way Thread the needle 5x each  Edu: frequency of exercise, recovery (sleep, nutrition, protein intake, stress, etc.), gym progression parameters    1/14 Bike warm up 5 min L 4  STM to L QL and L lumbar paraspinals Childs pose Sidelying clams GTB 2x15ea Cross legged 3 way lumbar stretch x30sec each Sit to stands with GTB at thighs x10 Staggered STS x10ea Deadlifts-10lb KB x10 Lunges with cues for glute activation x10ea     1/3  Bike warm up 5 min lvl 4  RPE 7-8 throughout  Program Notes 4x/week if upper/lower body split3x/week if doing total body  Exercises - Sit to Stand with Resistance Around Legs  - 1 x daily - 2-3 x weekly - 3 sets - 10 reps - Side Stepping with Resistance at Thighs  - 1 x daily - 2-3 x weekly - 1 sets - 3 reps - 54ft hold - Bent Over Single Arm Shoulder Row with Dumbbell  - 1 x daily - 2-3 x weekly - 3 sets - 8 reps - Standing Anti-Rotation Press with Anchored Resistance  - 1 x daily - 2-3 x weekly - 2  sets - 10 reps - Farmer's Carry with Kettlebells  - 1 x daily - 2-3 x weekly - 1 sets - 2-3 reps - 30-1ft hold - Full Leg Press  - 1 x daily - 2-3 x weekly - 4 sets - 8 reps - Deadlift with Resistance  - 1 x daily - 2-3 x weekly - 3 sets - 10 reps   12/16   Exercises - Sit to Stand with Resistance Around Legs  - 1 x daily - 2-3 x weekly - 3 sets - 10 reps - Standing Shoulder Row with Anchored Resistance  - 1 x daily - 2-3 x weekly - 3 sets - 10 reps - Deadlift with Resistance  - 1 x daily - 2-3 x weekly - 3 sets - 10 reps - Side Stepping with Resistance at Thighs  - 1 x daily - 2-3 x weekly - 1 sets - 3 reps - 42ft hold - Wall Push Up  -  1 x daily - 2-3 x weekly - 3 sets - 10 reps    PATIENT EDUCATION:  Education details:  exercise progression, DOMS expectations, muscle firing,  envelope of function, HEP, POC  Person educated: Patient Education method: Explanation, Demonstration, Tactile cues, Verbal cues,  Education comprehension: verbalized understanding, returned demonstration, and verbal cues required     HOME EXERCISE PROGRAM:   Access Code: [email or web address] URL: [email or web address] Date: 08/30/2023 Prepared by: Zebedee Iba            ASSESSMENT:   CLINICAL IMPRESSION:   Pt has objective and subjective improvement in strength and ROM as documented above. However, pt continues to have lumbar pain and new UE pain that limits ability to perform ADL as well as her QOL. Pt session focused largely on discussing exercise management for osteoporosis as well as best evidence regarding improving bone density. Pt advised on continuing current HEP but decreasing frequency and volume while increasing intensity in order to promote better recovery between sessions as pt's sleep is limited by lumbar pain. Pt is currently seeing MD regarding medical management of osteoporosis. UE NT unclear of cervical or distal entrapment in nature. Consider special testing at future sessions PRN. Plan to extend pt's POC but at this continued frequency of 1x/month for exercise program modifications and progression towards plyometrics as tolerated. Pt would benefit from continued skilled therapy in order to address current functional deficits and maximize functional strength for prevention of further functional decline.    OBJECTIVE IMPAIRMENTS decreased activity tolerance, decreased mobility, decreased ROM, decreased strength, postural dysfunction, and pain.    ACTIVITY LIMITATIONS carrying, lifting, squatting, sleeping, and locomotion level   PARTICIPATION LIMITATIONS:  community activity, and exercise   PERSONAL FACTORS Age, Fitness, Time  since onset of injury/illness/exacerbation, and 1-2 comorbidities:  are also affecting patient's functional outcome.          GOALS:     SHORT TERM GOALS: Target date: 10/11/2023       Pt will become independent with HEP in order to demonstrate synthesis of PT education.     Goal status: MET     2.  Pt will score at least 3 pt increase on FOTO to demonstrate functional improvement in MCII and pt perceived function.     Goal status: MET     LONG TERM GOALS: Target date: 03/20/2024       Pt  will become independent with final HEP in order to demonstrate synthesis of PT education.    Goal status: MET  2.  Pt will score >/= 65 on FOTO to demonstrate improvement in perceived lumbar function.      Goal status: MET   3.  Pt will be able to lift/squat/hold at least 50-85% of estimated 1RM for multiple sets and reps in order to meet ASCM guideline for appropriate dosage of resistance exercise for maintaining bone health.   Goal status: ongoing   4.  Pt will be able to demonstrate ability to perform light/moderate modified impact activity without back pain in order to demonstrate functional improvement in lumbopelvic tolerance to exercise as well as tolerance to recommended exercise for maintaining bone density/health.    Goal status: ongoing     PLAN:   PLAN: PT FREQUENCY: 1x/month   PT DURATION: 12 weeks    PLANNED INTERVENTIONS: Therapeutic exercises, Therapeutic activity, Neuromuscular re-education, Balance training, Gait training, Patient/Family education, Joint manipulation, Joint mobilization, Stair training, Orthotic/Fit training, DME instructions, Aquatic Therapy, Dry Needling, Electrical stimulation, Spinal manipulation, Spinal mobilization, Cryotherapy, Moist heat, scar mobilization, Splintting, Taping, Vasopneumatic device, Traction, Ultrasound, Ionotophoresis 4mg /ml Dexamethasone, Manual therapy, and Re-evaluation.   PLAN FOR NEXT SESSION: Progress gym based  strength program and begin free weight based exercise for LE strength and lumbopelvic strength;       Zebedee Iba, PT 12/27/2023, 4:31 PM   Referring diagnosis? M81.0 (ICD-10-CM) - Age-related osteoporosis without current pathological fracture  Treatment diagnosis? (if different than referring diagnosis) m62.81, m54.50 What was this (referring dx) caused by? []  Surgery []  Fall [x]  Ongoing issue []  Arthritis [x]  Other: osteopororis     Laterality: []  Rt []  Lt [x]  Both   Check all possible CPT codes:                                                        []  97110 (Therapeutic Exercise)                   []  92507 (SLP Treatment)      []  97112 (Neuro Re-ed)                                []  91478 (Swallowing Treatment)                  []  97116 (Gait Training)                                []  [id number] (Cognitive Training, 1st 15 minutes) []  97140 (Manual Therapy)                           []  97130 (Cognitive Training, each add'l 15 minutes)         []  97530 (Therapeutic Activities)                   []  Other, List CPT Code ____________                        []  29562 (Self Care)                                                               [  x] All codes above (97110 - 97535)             []  97012 (Mechanical Traction)             [x]  97014 (E-stim Unattended)             [x]  97032 (E-stim manual)             [x]  97033 (Ionto)             [x]  97035 (Ultrasound)             [x]  97760 (Orthotic Fit) []  78295 (Physical Performance Training) [x]  [id number] (Aquatic Therapy) []  97034 (Contrast Bath) []  [id number] (Paraffin) []  97597 (Wound Care 1st 20 sq cm) []  97598 (Wound Care each add'l 20 sq cm) []  97016 (Vasopneumatic Device) []  [phone] Public affairs consultant) []  [id number] (Prosthetic Training)

## 2024-01-10 ENCOUNTER — Encounter: Payer: Self-pay | Admitting: Family Medicine

## 2024-01-10 NOTE — Telephone Encounter (Signed)
Forwarding to Dr. Corey to review.  

## 2024-01-11 NOTE — Telephone Encounter (Signed)
 Forwarding to Dr. Denyse Amass to review and advise.

## 2024-01-18 DIAGNOSIS — H9313 Tinnitus, bilateral: Secondary | ICD-10-CM | POA: Diagnosis not present

## 2024-01-24 ENCOUNTER — Encounter (HOSPITAL_BASED_OUTPATIENT_CLINIC_OR_DEPARTMENT_OTHER): Admitting: Physical Therapy

## 2024-01-31 ENCOUNTER — Encounter (HOSPITAL_BASED_OUTPATIENT_CLINIC_OR_DEPARTMENT_OTHER): Payer: Self-pay | Admitting: Physical Therapy

## 2024-01-31 ENCOUNTER — Ambulatory Visit (HOSPITAL_BASED_OUTPATIENT_CLINIC_OR_DEPARTMENT_OTHER): Attending: Internal Medicine | Admitting: Physical Therapy

## 2024-01-31 DIAGNOSIS — R293 Abnormal posture: Secondary | ICD-10-CM | POA: Insufficient documentation

## 2024-01-31 DIAGNOSIS — M545 Low back pain, unspecified: Secondary | ICD-10-CM | POA: Insufficient documentation

## 2024-01-31 DIAGNOSIS — M6281 Muscle weakness (generalized): Secondary | ICD-10-CM | POA: Diagnosis not present

## 2024-01-31 NOTE — Therapy (Signed)
 OUTPATIENT PHYSICAL THERAPY LOWER EXTREMITY TREATMENT  Patient Name: Mary Key MRN: [id number] DOB:1955-12-13, 68 y.o., female Today's Date: 01/31/2024  END OF SESSION:  PT End of Session - 01/31/24 1237     Visit Number 8    Number of Visits 12    Date for PT Re-Evaluation 03/26/24    Authorization Type [employer] M[employer]    PT Start Time 1145    PT Stop Time 1225    PT Time Calculation (min) 40 min    Activity Tolerance Patient tolerated treatment well    Behavior During Therapy WFL for tasks assessed/performed                   Past Medical History:  Diagnosis Date   ADD (attention deficit disorder)    Anxiety    Arthritis    Depression    Difficulty sleeping    takes ambien  PRN   GERD (gastroesophageal reflux disease)    eats carefully    Hx of hepatitis C    cured with medication   Thyroid  cancer [facility])    Past Surgical History:  Procedure Laterality Date   THYROIDECTOMY N/A 06/13/2015   Procedure: TOTAL THYROIDECTOMY;  Surgeon: Oralee Billow, MD;  Location: [facility];  Service: General;  Laterality: N/A;   TONSILLECTOMY     Patient Active Problem List   Diagnosis Date Noted   Osteoporosis 12/02/2023   Pelvic mass in female 08/27/2015   Hypocalcemia 06/28/2015   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   Thyroid  carcinoma (HCC) 06/13/2015   Papillary thyroid  carcinoma (HCC) 06/12/2015    PCP: Tena Feeling, MD   REFERRING PROVIDER:  Gordy Lauber, MD     REFERRING DIAG: M81.0 (ICD-10-CM) - Age-related osteoporosis without current pathological fracture   THERAPY DIAG:  Muscle weakness (generalized)  Pain, lumbar region  Abnormal posture  Rationale for Evaluation and Treatment: Rehabilitation  ONSET DATE: 2020  SUBJECTIVE:   SUBJECTIVE STATEMENT:  Pt has had improvement in symptoms since last session. NT has improved outside of sitting in a particular chair. Pt has been exercising and progressing in the gym. Recovery is better since last  session. Pt is continuing to walk to 3-4x per week for 3-5 miles at a time. She is also more active in the yard and house as well. Pt does note neck pain with her history of neck degenerative changes.      Eval:  Pt states she is here for largely osteoporosis prevention but does have history of chronic LBP and hip pain. Pt states endocrinologist indicated she has osteoporosis and would like her to work on her bone density. She has been flip-flopping into osteoporosis vs osteopenia. Pt is taking vitamin D and calcium  supplementation but has reduced it due to GI issues. Pt had xrays negative for compression fracture at the lumbar spine.  Pt states she has been very active throughout her life. However, with COVID, she all of a sudden became less active. Her low back started to hurt and then has progressed into the hips over the last year and a half. She has had some aching down the legs. Happens mainly with sleeping and first waking up in the morning. Denies NT. Denies focal weakness.    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 Not currently  PRECAUTIONS: None  RED FLAGS: None   WEIGHT BEARING RESTRICTIONS: No  FALLS:  Has patient fallen in last 6 months? No  LIVING ENVIRONMENT: Lives with: lives with their family and lives with their spouse  OCCUPATION: N/A  PLOF: Independent  PATIENT GOALS: prevent further progression of osteoporosis and back pain    OBJECTIVE:  Note: Objective measures were completed at Evaluation unless otherwise noted.  DIAGNOSTIC FINDINGS:  IMPRESSION: Degenerative changes. No acute osseous abnormalities  PATIENT SURVEYS:  FOTO 61 65 @ DC 3 pts MCII   70% FOTO 4/14    SCREENING FOR  RED FLAGS: Bowel or bladder incontinence: No Spinal tumors: No Cauda equina syndrome: No Compression fracture: No Abdominal aneurysm: No    POSTURE: rounded shoulders and increased thoracic kyphosis    LUMBAR ROM:    AROM eval 4/14  Flexion 80% 90%  Extension 100% 100%  Right lateral flexion 100% 100%  Left lateral flexion 100% 100%  Right rotation 100% 100%  Left rotation 100% 100%   (Blank rows = not tested)   LOWER EXTREMITY ROM:  WFL for tasks assess   LOWER EXTREMITY MMT:  none painful   MMT Right eval R 4/14 Left eval L 4/14  Hip flexion 4+/5 5/5 4+/5 5/5  Hip extension        Hip abduction 4+/5 5/5 4+/5 5/5  Hip adduction 4+/5 4+/5 4+/5 4+/5  Hip internal rotation        Hip external rotation  4+/5 4+/5 4+/5  4+/5   (Blank rows = not tested)  GAIT: Distance walked: 64ft Assistive device utilized: None Level of assistance: Complete Independence Comments: WNL    TODAY'S TREATMENT:                                                                                                                              DATE:    5/19  Cervical snag rotation and ext 10x each  DL hop 1O10  Skipping progression   Symptom management and exercise management  Resistance training and OP management/progression into plyometrics   4/14   Program Notes 2x/week total body consider massage therapy  Exercises - Sit to Stand with Resistance Around Legs  - 1 x daily - 2 x weekly - 4 sets - 6 reps - Side Stepping with Resistance at Thighs  - 1 x daily - 2 x weekly - 1 sets - 3 reps - 63ft hold - Standing Anti-Rotation Press with Anchored Resistance  - 1 x daily - 2 x weekly - 2 sets - 10 reps - Farmer's Carry with Kettlebells  - 1 x daily - 2 x weekly - 1 sets - 2-3 reps - 30-25ft hold - Full Leg Press  - 1 x daily - 2 x weekly - 3 sets - 8 reps - Deadlift with Resistance  - 1 x daily - 2-3 x weekly - 3 sets - 8 reps -  Bent Over Single Arm Shoulder Row with Dumbbell  - 1 x  daily - 2-3 x weekly - 3 sets - 8 reps - Seated Quadratus Lumborum Stretch with Forward Bend  - 1-2 x daily - 7 x weekly - 2-3 sets - 20-30seconds hold - Clam with Resistance  - 1 x daily - 7 x weekly - 3 sets - 8 reps - Supine Hamstring Stretch  - 2 x daily - 7 x weekly - 1 sets - 10 reps - 10 hold - Prone Quadriceps Stretch with Strap  - 2 x daily - 7 x weekly - 1 sets - 3 reps - 30 hold - Hip External Rotation Stretch  - 2 x daily - 7 x weekly - 1 sets - 3 reps - 30 hold - Piriformis Mobilization on Foam Roll  - 1 x daily - 7 x weekly - 1 sets - 1 reps - 2-3 min hold - Quadriceps Mobilization with Foam Roll  - 1 x daily - 7 x weekly - 1 sets - 1 reps - 2-3 min hold - Hamstring Mobilization with Foam Roll  - 1 x daily - 7 x weekly - 1 sets - 1 reps - 2-3 min hold - Seated Cervical Retraction  - 5-6 x daily - 7 x weekly - 1 sets - 5-10 reps - Median Nerve Glide  - 1 x daily - 7 x weekly - 1 sets - 10 reps - 2 hold - Ulnar Nerve Flossing  - 1 x daily - 7 x weekly - 1 sets - 10 reps - 2 hold - Standing Shoulder Horizontal Abduction with Resistance  - 1 x daily - 2-3 x weekly - 3 sets - 8 reps   3/13   Program Notes 3-4x/week if upper/lower body split2-3x/week if doing total body  Exercises - Sit to Stand with Resistance Around Legs  - 1 x daily - 2-3 x weekly - 3 sets - 10 reps - Side Stepping with Resistance at Thighs  - 1 x daily - 2-3 x weekly - 1 sets - 3 reps - 58ft hold - Standing Anti-Rotation Press with Anchored Resistance  - 1 x daily - 2-3 x weekly - 2 sets - 10 reps - Farmer's Carry with Kettlebells  - 1 x daily - 2-3 x weekly - 1 sets - 2-3 reps - 30-35ft hold - Full Leg Press  - 1 x daily - 2-3 x weekly - 4 sets - 8 reps - Deadlift with Resistance  - 1 x daily - 2-3 x weekly - 3 sets - 10 reps - Bent Over Single Arm Shoulder Row with Dumbbell  - 1 x daily - 2-3 x weekly - 3 sets - 8 reps - Seated Quadratus Lumborum Stretch with Forward Bend  - 1-2 x daily - 7 x weekly - 2-3  sets - 20-30seconds hold - Clam with Resistance  - 1 x daily - 7 x weekly - 2 sets - 15 reps - Supine Hamstring Stretch  - 2 x daily - 7 x weekly - 1 sets - 10 reps - 10 hold - Prone Quadriceps Stretch with Strap  - 2 x daily - 7 x weekly - 1 sets - 3 reps - 30 hold - Hip External Rotation Stretch  - 2 x daily - 7 x weekly - 1 sets - 3 reps - 30 hold - Piriformis Mobilization on Foam Roll  - 1 x daily - 7 x weekly - 1 sets - 1  reps - 2-3 min hold - Quadriceps Mobilization with Foam Roll  - 1 x daily - 7 x weekly - 1 sets - 1 reps - 2-3 min hold - Hamstring Mobilization with Foam Roll  - 1 x daily - 7 x weekly - 1 sets - 1 reps - 2-3 min hold - Seated Cervical Retraction  - 5-6 x daily - 7 x weekly - 1 sets - 5-10 reps - Standing Shoulder Horizontal Abduction with Resistance  - 1 x daily - 2-3 x weekly - 3 sets - 10 reps   Exercise modifications, programming options, recovery times, RPE for weight selection  (-) slump (-) SLR  2/12 - Supine Hamstring Stretch  - 2 x daily - 7 x weekly - 1 sets - 10 reps - 10 hold - Prone Quadriceps Stretch with Strap  - 2 x daily - 7 x weekly - 1 sets - 3 reps - 30 hold - Hip External Rotation Stretch  - 2 x daily - 7 x weekly - 1 sets - 3 reps - 30 hold - Piriformis Mobilization on Foam Roll  - 1 x daily - 7 x weekly - 1 sets - 1 reps - 2-3 min hold - Quadriceps Mobilization with Foam Roll  - 1 x daily - 7 x weekly - 1 sets - 1 reps - 2-3 min hold - Hamstring Mobilization with Foam Roll  - 1 x daily - 7 x weekly - 1 sets - 1 reps - 2-3 min hold  Gym periodization options, recovery/self treatment options  1/29  STM to L QL and L lumbar paraspinals L UPA L1--5 grade III; CPA L1-5 grade III Cat/cow 12x each way Thread the needle 5x each  Edu: frequency of exercise, recovery (sleep, nutrition, protein intake, stress, etc.), gym progression parameters    1/14 Bike warm up 5 min L 4  STM to L QL and L lumbar paraspinals Childs pose Sidelying clams  GTB 2x15ea Cross legged 3 way lumbar stretch x30sec each Sit to stands with GTB at thighs x10 Staggered STS x10ea Deadlifts-10lb KB x10 Lunges with cues for glute activation x10ea     1/3  Bike warm up 5 min lvl 4  RPE 7-8 throughout  Program Notes 4x/week if upper/lower body split3x/week if doing total body  Exercises - Sit to Stand with Resistance Around Legs  - 1 x daily - 2-3 x weekly - 3 sets - 10 reps - Side Stepping with Resistance at Thighs  - 1 x daily - 2-3 x weekly - 1 sets - 3 reps - 67ft hold - Bent Over Single Arm Shoulder Row with Dumbbell  - 1 x daily - 2-3 x weekly - 3 sets - 8 reps - Standing Anti-Rotation Press with Anchored Resistance  - 1 x daily - 2-3 x weekly - 2 sets - 10 reps - Farmer's Carry with Kettlebells  - 1 x daily - 2-3 x weekly - 1 sets - 2-3 reps - 30-58ft hold - Full Leg Press  - 1 x daily - 2-3 x weekly - 4 sets - 8 reps - Deadlift with Resistance  - 1 x daily - 2-3 x weekly - 3 sets - 10 reps   12/16   Exercises - Sit to Stand with Resistance Around Legs  - 1 x daily - 2-3 x weekly - 3 sets - 10 reps - Standing Shoulder Row with Anchored Resistance  - 1 x daily - 2-3 x weekly - 3 sets -  10 reps - Deadlift with Resistance  - 1 x daily - 2-3 x weekly - 3 sets - 10 reps - Side Stepping with Resistance at Thighs  - 1 x daily - 2-3 x weekly - 1 sets - 3 reps - 29ft hold - Wall Push Up  - 1 x daily - 2-3 x weekly - 3 sets - 10 reps    PATIENT EDUCATION:  Education details:  exercise progression, DOMS expectations, muscle firing,  envelope of function, HEP, POC  Person educated: Patient Education method: Explanation, Demonstration, Tactile cues, Verbal cues,  Education comprehension: verbalized understanding, returned demonstration, and verbal cues required     HOME EXERCISE PROGRAM:   Access Code: [email or web address] URL: [email or web address] Date: 08/30/2023 Prepared by: Silver Dross            ASSESSMENT:   CLINICAL  IMPRESSION:   Pt with decline in LBP and frequency of painful symptoms into the UE. Pt was able to tolerate and progress into light plyometrics today. Pt advised to start with low frequency and low volume at the start of DL assisted hopping to assess response at the L/S and LE. Then progress to more frequent sessions up to 2-3x/week. Plan to continue with progression of OP and LBP management. C/S mobility progressed today as well for HEP. Consider D/C at next session if pain is well managed. Pt would benefit from continued skilled therapy in order to address current functional deficits and maximize functional strength for prevention of further functional decline.    OBJECTIVE IMPAIRMENTS decreased activity tolerance, decreased mobility, decreased ROM, decreased strength, postural dysfunction, and pain.    ACTIVITY LIMITATIONS carrying, lifting, squatting, sleeping, and locomotion level   PARTICIPATION LIMITATIONS:  community activity, and exercise   PERSONAL FACTORS Age, Fitness, Time since onset of injury/illness/exacerbation, and 1-2 comorbidities:  are also affecting patient's functional outcome.          GOALS:     SHORT TERM GOALS: Target date: 10/11/2023       Pt will become independent with HEP in order to demonstrate synthesis of PT education.     Goal status: MET     2.  Pt will score at least 3 pt increase on FOTO to demonstrate functional improvement in MCII and pt perceived function.     Goal status: MET     LONG TERM GOALS: Target date: 03/20/2024       Pt  will become independent with final HEP in order to demonstrate synthesis of PT education.    Goal status: MET   2.  Pt will score >/= 65 on FOTO to demonstrate improvement in perceived lumbar function.      Goal status: MET   3.  Pt will be able to lift/squat/hold at least 50-85% of estimated 1RM for multiple sets and reps in order to meet ASCM guideline for appropriate dosage of resistance exercise for  maintaining bone health.   Goal status: ongoing   4.  Pt will be able to demonstrate ability to perform light/moderate modified impact activity without back pain in order to demonstrate functional improvement in lumbopelvic tolerance to exercise as well as tolerance to recommended exercise for maintaining bone density/health.    Goal status: ongoing     PLAN:   PLAN: PT FREQUENCY: 1x/month   PT DURATION: 12 weeks    PLANNED INTERVENTIONS: Therapeutic exercises, Therapeutic activity, Neuromuscular re-education, Balance training, Gait training, Patient/Family education, Joint manipulation, Joint mobilization, Stair training, Orthotic/Fit training, DME instructions, Aquatic Therapy, Dry  Needling, Electrical stimulation, Spinal manipulation, Spinal mobilization, Cryotherapy, Moist heat, scar mobilization, Splintting, Taping, Vasopneumatic device, Traction, Ultrasound, Ionotophoresis 4mg /ml Dexamethasone , Manual therapy, and Re-evaluation.   PLAN FOR NEXT SESSION: Progress gym based strength program and begin free weight based exercise for LE strength and lumbopelvic strength;       Silver Dross, PT 01/31/2024, 12:51 PM   Referring diagnosis? M81.0 (ICD-10-CM) - Age-related osteoporosis without current pathological fracture  Treatment diagnosis? (if different than referring diagnosis) m62.81, m54.50 What was this (referring dx) caused by? []  Surgery []  Fall [x]  Ongoing issue []  Arthritis [x]  Other: osteopororis     Laterality: []  Rt []  Lt [x]  Both   Check all possible CPT codes:                                                        []  97110 (Therapeutic Exercise)                   []  92507 (SLP Treatment)      []  97112 (Neuro Re-ed)                                []  92526 (Swallowing Treatment)                  []  91478 (Gait Training)                                []  29562 (Cognitive Training, 1st 15 minutes) []  97140 (Manual Therapy)                           []  97130  (Cognitive Training, each add'l 15 minutes)         []  97530 (Therapeutic Activities)                   []  Other, List CPT Code ____________                        []  97535 (Self Care)                                                               [x]  All codes above (97110 - 97535)             []  97012 (Mechanical Traction)             [x]  97014 (E-stim Unattended)             [x]  97032 (E-stim manual)             [x]  97033 (Ionto)             [x]  97035 (Ultrasound)             [x]  97760 (Orthotic Fit) []  [id number] (Physical Performance Training) [x]  [id number] (Aquatic Therapy) []  97034 (Contrast Bath) []  [id number] (Paraffin) []  97597 (Wound Care 1st 20 sq cm) []  97598 (Wound Care each add'l 20  sq cm) []  97016 (Vasopneumatic Device) []  [id number] Public affairs consultant) []  [phone] (Prosthetic Training)

## 2024-02-08 DIAGNOSIS — H9313 Tinnitus, bilateral: Secondary | ICD-10-CM | POA: Diagnosis not present

## 2024-02-08 DIAGNOSIS — H903 Sensorineural hearing loss, bilateral: Secondary | ICD-10-CM | POA: Diagnosis not present

## 2024-02-10 DIAGNOSIS — F900 Attention-deficit hyperactivity disorder, predominantly inattentive type: Secondary | ICD-10-CM | POA: Diagnosis not present

## 2024-02-10 DIAGNOSIS — F341 Dysthymic disorder: Secondary | ICD-10-CM | POA: Diagnosis not present

## 2024-02-10 DIAGNOSIS — Z5181 Encounter for therapeutic drug level monitoring: Secondary | ICD-10-CM | POA: Diagnosis not present

## 2024-02-10 DIAGNOSIS — F3342 Major depressive disorder, recurrent, in full remission: Secondary | ICD-10-CM | POA: Diagnosis not present

## 2024-02-10 DIAGNOSIS — Z79899 Other long term (current) drug therapy: Secondary | ICD-10-CM | POA: Diagnosis not present

## 2024-02-21 ENCOUNTER — Encounter (HOSPITAL_BASED_OUTPATIENT_CLINIC_OR_DEPARTMENT_OTHER): Admitting: Physical Therapy

## 2024-02-28 ENCOUNTER — Encounter (HOSPITAL_BASED_OUTPATIENT_CLINIC_OR_DEPARTMENT_OTHER): Payer: Self-pay | Admitting: Physical Therapy

## 2024-02-28 ENCOUNTER — Ambulatory Visit (HOSPITAL_BASED_OUTPATIENT_CLINIC_OR_DEPARTMENT_OTHER): Attending: Internal Medicine | Admitting: Physical Therapy

## 2024-02-28 DIAGNOSIS — R293 Abnormal posture: Secondary | ICD-10-CM | POA: Diagnosis not present

## 2024-02-28 DIAGNOSIS — M6281 Muscle weakness (generalized): Secondary | ICD-10-CM | POA: Insufficient documentation

## 2024-02-28 DIAGNOSIS — M545 Low back pain, unspecified: Secondary | ICD-10-CM | POA: Diagnosis not present

## 2024-02-28 NOTE — Therapy (Signed)
 OUTPATIENT PHYSICAL THERAPY LOWER EXTREMITY TREATMENT  PHYSICAL THERAPY DISCHARGE SUMMARY  Visits from Start of Care: 9 Plan: Patient agrees to discharge.  Patient goals were met. Patient is being discharged due to meeting the stated rehab goals.        Patient Name: Mary Key MRN: [id number] DOB:[birth year], 68 y.o., female Today's Date: 02/28/2024  END OF SESSION:  PT End of Session - 02/28/24 1325     Visit Number 9    Number of Visits 12    Date for PT Re-Evaluation 03/26/24    Authorization Type [employer] M[employer]    PT Start Time 1325    PT Stop Time 1354    PT Time Calculation (min) 29 min    Activity Tolerance Patient tolerated treatment well    Behavior During Therapy WFL for tasks assessed/performed                Past Medical History:  Diagnosis Date   ADD (attention deficit disorder)    Anxiety    Arthritis    Depression    Difficulty sleeping    takes ambien  PRN   GERD (gastroesophageal reflux disease)    eats carefully    Hx of hepatitis C    cured with medication   Thyroid  cancer [facility])    Past Surgical History:  Procedure Laterality Date   THYROIDECTOMY N/A 06/13/2015   Procedure: TOTAL THYROIDECTOMY;  Surgeon: Oralee Billow, MD;  Location: [facility];  Service: General;  Laterality: N/A;   TONSILLECTOMY     Patient Active Problem List   Diagnosis Date Noted   Osteoporosis 12/02/2023   Pelvic mass in female 08/27/2015   Hypocalcemia 06/28/2015   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   Thyroid  carcinoma (HCC) 06/13/2015   Papillary thyroid  carcinoma (HCC) 06/12/2015    PCP: Tena Feeling, MD   REFERRING PROVIDER:  Gordy Lauber, MD     REFERRING DIAG: M81.0 (ICD-10-CM) - Age-related osteoporosis without current pathological fracture   THERAPY DIAG:  Muscle weakness (generalized)  Pain, lumbar region  Abnormal posture  Rationale for Evaluation and Treatment: Rehabilitation  ONSET DATE: 2020  SUBJECTIVE:   SUBJECTIVE  STATEMENT:  Pt states that things are better. She has been doing well with HEP and is doing light plyos 1x/week. She has pain but is managing the discomfort. She has ramped up the weight since last visit. The recovery is still working. Pt goes to [place] currently still. Pt reports she feels independent and is capable of managing her pain and exercise moving forwrad. She is exercising regularly and improving with each session.    Eval:  Pt states she is here for largely osteoporosis prevention but does have history of chronic LBP and hip pain. Pt states endocrinologist indicated she has osteoporosis and would like her to work on her bone density. She has been flip-flopping into osteoporosis vs osteopenia. Pt is taking vitamin D and calcium  supplementation but has reduced it due to GI issues. Pt had xrays negative for compression fracture at the lumbar spine.  Pt states she has been very active throughout her life. However, with COVID, she all of a sudden became less active. Her low back started to hurt and then has progressed into the hips over the last year and a half. She has had some aching down the legs. Happens mainly with sleeping and first waking up in the morning. Denies NT. Denies focal weakness.    Exercise: walking 3-4 miles 3-4x/week; stretch zone, assisted squats; no resistance exercise currently  but has worked with [employer] off and on for a few year  Member of [employer] and [employer] available   PERTINENT HISTORY: Hypothyroidism, anxiety and depression PAIN:  Are you having pain? Not currently  PRECAUTIONS: None  RED FLAGS: None   WEIGHT BEARING RESTRICTIONS: No  FALLS:  Has patient fallen in last 6 months? No  LIVING ENVIRONMENT: Lives with: lives with their family and lives with their spouse  OCCUPATION: N/A  PLOF: Independent  PATIENT GOALS: prevent further progression of osteoporosis and back pain    OBJECTIVE:  Note: Objective measures  were completed at Evaluation unless otherwise noted.  DIAGNOSTIC FINDINGS:  IMPRESSION: Degenerative changes. No acute osseous abnormalities  PATIENT SURVEYS:  FOTO 61 65 @ DC 3 pts MCII   70% FOTO 4/14    SCREENING FOR RED FLAGS: Bowel or bladder incontinence: No Spinal tumors: No Cauda equina syndrome: No Compression fracture: No Abdominal aneurysm: No    POSTURE: rounded shoulders and increased thoracic kyphosis    LUMBAR ROM:    AROM eval 4/14 6/16  Flexion 80% 90% 90%  Extension 100% 100% 100%  Right lateral flexion 100% 100% 100%  Left lateral flexion 100% 100% 100%  Right rotation 100% 100% 100%  Left rotation 100% 100% 100%   (Blank rows = not tested)   LOWER EXTREMITY ROM:  WFL for tasks assess   LOWER EXTREMITY MMT:  none painful   MMT Right eval R 6/16 Left eval L 6/16  Hip flexion 4+/5 5/5 4+/5 5/5  Hip extension        Hip abduction 4+/5 5/5 4+/5 5/5  Hip adduction 4+/5 5/5 4+/5 5/5  Hip internal rotation        Hip external rotation  4+/5 4+/5 4+/5  4+/5   (Blank rows = not tested)  GAIT: Distance walked: 18ft Assistive device utilized: None Level of assistance: Complete Independence Comments: WNL    TODAY'S TREATMENT:                                                                                                                              DATE:    6/16  Osteoporosis management, exercise progression regression, exercise programming services, return to gym, self pain management resources, POC  5/19  Cervical snag rotation and ext 10x each  DL hop 1O10  Skipping progression   Symptom management and exercise management  Resistance training and OP management/progression into plyometrics   4/14   Program Notes 2x/week total body consider massage therapy  Exercises - Sit to Stand with Resistance Around Legs  - 1 x daily - 2 x weekly - 4 sets - 6 reps - Side Stepping with Resistance at Thighs  - 1 x daily - 2 x weekly - 1  sets - 3 reps - 42ft hold - Standing Anti-Rotation Press with Anchored Resistance  - 1 x daily - 2 x weekly - 2 sets - 10 reps -  Farmer's Carry with Kettlebells  - 1 x daily - 2 x weekly - 1 sets - 2-3 reps - 30-31ft hold - Full Leg Press  - 1 x daily - 2 x weekly - 3 sets - 8 reps - Deadlift with Resistance  - 1 x daily - 2-3 x weekly - 3 sets - 8 reps - Bent Over Single Arm Shoulder Row with Dumbbell  - 1 x daily - 2-3 x weekly - 3 sets - 8 reps - Seated Quadratus Lumborum Stretch with Forward Bend  - 1-2 x daily - 7 x weekly - 2-3 sets - 20-30seconds hold - Clam with Resistance  - 1 x daily - 7 x weekly - 3 sets - 8 reps - Supine Hamstring Stretch  - 2 x daily - 7 x weekly - 1 sets - 10 reps - 10 hold - Prone Quadriceps Stretch with Strap  - 2 x daily - 7 x weekly - 1 sets - 3 reps - 30 hold - Hip External Rotation Stretch  - 2 x daily - 7 x weekly - 1 sets - 3 reps - 30 hold - Piriformis Mobilization on Foam Roll  - 1 x daily - 7 x weekly - 1 sets - 1 reps - 2-3 min hold - Quadriceps Mobilization with Foam Roll  - 1 x daily - 7 x weekly - 1 sets - 1 reps - 2-3 min hold - Hamstring Mobilization with Foam Roll  - 1 x daily - 7 x weekly - 1 sets - 1 reps - 2-3 min hold - Seated Cervical Retraction  - 5-6 x daily - 7 x weekly - 1 sets - 5-10 reps - Median Nerve Glide  - 1 x daily - 7 x weekly - 1 sets - 10 reps - 2 hold - Ulnar Nerve Flossing  - 1 x daily - 7 x weekly - 1 sets - 10 reps - 2 hold - Standing Shoulder Horizontal Abduction with Resistance  - 1 x daily - 2-3 x weekly - 3 sets - 8 reps   3/13   Program Notes 3-4x/week if upper/lower body split2-3x/week if doing total body  Exercises - Sit to Stand with Resistance Around Legs  - 1 x daily - 2-3 x weekly - 3 sets - 10 reps - Side Stepping with Resistance at Thighs  - 1 x daily - 2-3 x weekly - 1 sets - 3 reps - 42ft hold - Standing Anti-Rotation Press with Anchored Resistance  - 1 x daily - 2-3 x weekly - 2 sets - 10 reps -  Farmer's Carry with Kettlebells  - 1 x daily - 2-3 x weekly - 1 sets - 2-3 reps - 30-35ft hold - Full Leg Press  - 1 x daily - 2-3 x weekly - 4 sets - 8 reps - Deadlift with Resistance  - 1 x daily - 2-3 x weekly - 3 sets - 10 reps - Bent Over Single Arm Shoulder Row with Dumbbell  - 1 x daily - 2-3 x weekly - 3 sets - 8 reps - Seated Quadratus Lumborum Stretch with Forward Bend  - 1-2 x daily - 7 x weekly - 2-3 sets - 20-30seconds hold - Clam with Resistance  - 1 x daily - 7 x weekly - 2 sets - 15 reps - Supine Hamstring Stretch  - 2 x daily - 7 x weekly - 1 sets - 10 reps - 10 hold - Prone Quadriceps Stretch with Strap  -  2 x daily - 7 x weekly - 1 sets - 3 reps - 30 hold - Hip External Rotation Stretch  - 2 x daily - 7 x weekly - 1 sets - 3 reps - 30 hold - Piriformis Mobilization on Foam Roll  - 1 x daily - 7 x weekly - 1 sets - 1 reps - 2-3 min hold - Quadriceps Mobilization with Foam Roll  - 1 x daily - 7 x weekly - 1 sets - 1 reps - 2-3 min hold - Hamstring Mobilization with Foam Roll  - 1 x daily - 7 x weekly - 1 sets - 1 reps - 2-3 min hold - Seated Cervical Retraction  - 5-6 x daily - 7 x weekly - 1 sets - 5-10 reps - Standing Shoulder Horizontal Abduction with Resistance  - 1 x daily - 2-3 x weekly - 3 sets - 10 reps   Exercise modifications, programming options, recovery times, RPE for weight selection  (-) slump (-) SLR  2/12 - Supine Hamstring Stretch  - 2 x daily - 7 x weekly - 1 sets - 10 reps - 10 hold - Prone Quadriceps Stretch with Strap  - 2 x daily - 7 x weekly - 1 sets - 3 reps - 30 hold - Hip External Rotation Stretch  - 2 x daily - 7 x weekly - 1 sets - 3 reps - 30 hold - Piriformis Mobilization on Foam Roll  - 1 x daily - 7 x weekly - 1 sets - 1 reps - 2-3 min hold - Quadriceps Mobilization with Foam Roll  - 1 x daily - 7 x weekly - 1 sets - 1 reps - 2-3 min hold - Hamstring Mobilization with Foam Roll  - 1 x daily - 7 x weekly - 1 sets - 1 reps - 2-3 min  hold  Gym periodization options, recovery/self treatment options  1/29  STM to L QL and L lumbar paraspinals L UPA L1--5 grade III; CPA L1-5 grade III Cat/cow 12x each way Thread the needle 5x each  Edu: frequency of exercise, recovery (sleep, nutrition, protein intake, stress, etc.), gym progression parameters    1/14 Bike warm up 5 min L 4  STM to L QL and L lumbar paraspinals Childs pose Sidelying clams GTB 2x15ea Cross legged 3 way lumbar stretch x30sec each Sit to stands with GTB at thighs x10 Staggered STS x10ea Deadlifts-10lb KB x10 Lunges with cues for glute activation x10ea     1/3  Bike warm up 5 min lvl 4  RPE 7-8 throughout  Program Notes 4x/week if upper/lower body split3x/week if doing total body  Exercises - Sit to Stand with Resistance Around Legs  - 1 x daily - 2-3 x weekly - 3 sets - 10 reps - Side Stepping with Resistance at Thighs  - 1 x daily - 2-3 x weekly - 1 sets - 3 reps - 37ft hold - Bent Over Single Arm Shoulder Row with Dumbbell  - 1 x daily - 2-3 x weekly - 3 sets - 8 reps - Standing Anti-Rotation Press with Anchored Resistance  - 1 x daily - 2-3 x weekly - 2 sets - 10 reps - Farmer's Carry with Kettlebells  - 1 x daily - 2-3 x weekly - 1 sets - 2-3 reps - 30-85ft hold - Full Leg Press  - 1 x daily - 2-3 x weekly - 4 sets - 8 reps - Deadlift with Resistance  - 1 x  daily - 2-3 x weekly - 3 sets - 10 reps   12/16   Exercises - Sit to Stand with Resistance Around Legs  - 1 x daily - 2-3 x weekly - 3 sets - 10 reps - Standing Shoulder Row with Anchored Resistance  - 1 x daily - 2-3 x weekly - 3 sets - 10 reps - Deadlift with Resistance  - 1 x daily - 2-3 x weekly - 3 sets - 10 reps - Side Stepping with Resistance at Thighs  - 1 x daily - 2-3 x weekly - 1 sets - 3 reps - 7ft hold - Wall Push Up  - 1 x daily - 2-3 x weekly - 3 sets - 10 reps    PATIENT EDUCATION:  Education details:  exercise progression, DOMS expectations, muscle  firing,  envelope of function, HEP, POC  Person educated: Patient Education method: Explanation, Demonstration, Tactile cues, Verbal cues,  Education comprehension: verbalized understanding, returned demonstration, and verbal cues required     HOME EXERCISE PROGRAM:   Access Code: [email or web address] URL: [email or web address] Date: 08/30/2023 Prepared by: Silver Dross            ASSESSMENT:   CLINICAL IMPRESSION:   Pt has met all PT goals at this time and has good understanding of all exercise progression and independent management of pain. Pt provided resources for self pain management. Plan to D/C this case at this time.     OBJECTIVE IMPAIRMENTS decreased activity tolerance, decreased mobility, decreased ROM, decreased strength, postural dysfunction, and pain.    ACTIVITY LIMITATIONS carrying, lifting, squatting, sleeping, and locomotion level   PARTICIPATION LIMITATIONS:  community activity, and exercise   PERSONAL FACTORS Age, Fitness, Time since onset of injury/illness/exacerbation, and 1-2 comorbidities:  are also affecting patient's functional outcome.          GOALS:     SHORT TERM GOALS: Target date: 10/11/2023       Pt will become independent with HEP in order to demonstrate synthesis of PT education.     Goal status: MET     2.  Pt will score at least 3 pt increase on FOTO to demonstrate functional improvement in MCII and pt perceived function.     Goal status: MET     LONG TERM GOALS: Target date: 03/20/2024       Pt  will become independent with final HEP in order to demonstrate synthesis of PT education.    Goal status: MET   2.  Pt will score >/= 65 on FOTO to demonstrate improvement in perceived lumbar function.      Goal status: MET   3.  Pt will be able to lift/squat/hold at least 50-85% of estimated 1RM for multiple sets and reps in order to meet ASCM guideline for appropriate dosage of resistance exercise for maintaining bone health.    Goal status: MET   4.  Pt will be able to demonstrate ability to perform light/moderate modified impact activity without back pain in order to demonstrate functional improvement in lumbopelvic tolerance to exercise as well as tolerance to recommended exercise for maintaining bone density/health.    Goal status: MET     PLAN:   PLAN: PT FREQUENCY: 1x/month   PT DURATION: 12 weeks    PLANNED INTERVENTIONS: Therapeutic exercises, Therapeutic activity, Neuromuscular re-education, Balance training, Gait training, Patient/Family education, Joint manipulation, Joint mobilization, Stair training, Orthotic/Fit training, DME instructions, Aquatic Therapy, Dry Needling, Electrical stimulation, Spinal manipulation, Spinal mobilization, Cryotherapy, Moist heat, scar mobilization,  Splintting, Taping, Vasopneumatic device, Traction, Ultrasound, Ionotophoresis 4mg /ml Dexamethasone , Manual therapy, and Re-evaluation.   PLAN FOR NEXT SESSION: d/c     Silver Dross, PT 02/28/2024, 2:10 PM

## 2024-06-05 DIAGNOSIS — Z23 Encounter for immunization: Secondary | ICD-10-CM | POA: Diagnosis not present

## 2024-06-15 DIAGNOSIS — E7800 Pure hypercholesterolemia, unspecified: Secondary | ICD-10-CM | POA: Diagnosis not present

## 2024-06-15 DIAGNOSIS — E890 Postprocedural hypothyroidism: Secondary | ICD-10-CM | POA: Diagnosis not present

## 2024-06-15 DIAGNOSIS — Z1331 Encounter for screening for depression: Secondary | ICD-10-CM | POA: Diagnosis not present

## 2024-06-15 DIAGNOSIS — Z85850 Personal history of malignant neoplasm of thyroid: Secondary | ICD-10-CM | POA: Diagnosis not present

## 2024-06-15 DIAGNOSIS — M810 Age-related osteoporosis without current pathological fracture: Secondary | ICD-10-CM | POA: Diagnosis not present

## 2024-06-15 DIAGNOSIS — F5104 Psychophysiologic insomnia: Secondary | ICD-10-CM | POA: Diagnosis not present

## 2024-06-15 DIAGNOSIS — G4730 Sleep apnea, unspecified: Secondary | ICD-10-CM | POA: Diagnosis not present

## 2024-06-15 DIAGNOSIS — Z0000 Encounter for general adult medical examination without abnormal findings: Secondary | ICD-10-CM | POA: Diagnosis not present

## 2024-06-15 DIAGNOSIS — Z79899 Other long term (current) drug therapy: Secondary | ICD-10-CM | POA: Diagnosis not present

## 2024-06-15 DIAGNOSIS — F325 Major depressive disorder, single episode, in full remission: Secondary | ICD-10-CM | POA: Diagnosis not present

## 2024-06-15 DIAGNOSIS — R490 Dysphonia: Secondary | ICD-10-CM | POA: Diagnosis not present

## 2024-06-16 ENCOUNTER — Encounter (INDEPENDENT_AMBULATORY_CARE_PROVIDER_SITE_OTHER): Payer: Self-pay

## 2024-07-05 DIAGNOSIS — Z85828 Personal history of other malignant neoplasm of skin: Secondary | ICD-10-CM | POA: Diagnosis not present

## 2024-07-05 DIAGNOSIS — L821 Other seborrheic keratosis: Secondary | ICD-10-CM | POA: Diagnosis not present

## 2024-07-05 DIAGNOSIS — L814 Other melanin hyperpigmentation: Secondary | ICD-10-CM | POA: Diagnosis not present

## 2024-07-05 DIAGNOSIS — D1801 Hemangioma of skin and subcutaneous tissue: Secondary | ICD-10-CM | POA: Diagnosis not present

## 2024-07-05 DIAGNOSIS — Z08 Encounter for follow-up examination after completed treatment for malignant neoplasm: Secondary | ICD-10-CM | POA: Diagnosis not present

## 2024-07-10 DIAGNOSIS — Z85850 Personal history of malignant neoplasm of thyroid: Secondary | ICD-10-CM | POA: Diagnosis not present

## 2024-07-10 DIAGNOSIS — Z79899 Other long term (current) drug therapy: Secondary | ICD-10-CM | POA: Diagnosis not present

## 2024-07-10 DIAGNOSIS — E890 Postprocedural hypothyroidism: Secondary | ICD-10-CM | POA: Diagnosis not present

## 2024-07-10 DIAGNOSIS — E7800 Pure hypercholesterolemia, unspecified: Secondary | ICD-10-CM | POA: Diagnosis not present

## 2024-07-12 DIAGNOSIS — Z85850 Personal history of malignant neoplasm of thyroid: Secondary | ICD-10-CM | POA: Diagnosis not present

## 2024-07-12 DIAGNOSIS — E890 Postprocedural hypothyroidism: Secondary | ICD-10-CM | POA: Diagnosis not present

## 2024-07-12 DIAGNOSIS — M810 Age-related osteoporosis without current pathological fracture: Secondary | ICD-10-CM | POA: Diagnosis not present

## 2024-07-26 ENCOUNTER — Ambulatory Visit (INDEPENDENT_AMBULATORY_CARE_PROVIDER_SITE_OTHER): Admitting: Otolaryngology

## 2024-07-26 ENCOUNTER — Encounter (INDEPENDENT_AMBULATORY_CARE_PROVIDER_SITE_OTHER): Payer: Self-pay | Admitting: Otolaryngology

## 2024-07-26 VITALS — BP 155/68 | HR 72 | Ht 62.0 in | Wt 150.0 lb

## 2024-07-26 DIAGNOSIS — K219 Gastro-esophageal reflux disease without esophagitis: Secondary | ICD-10-CM | POA: Diagnosis not present

## 2024-07-26 DIAGNOSIS — R058 Other specified cough: Secondary | ICD-10-CM

## 2024-07-26 DIAGNOSIS — R0982 Postnasal drip: Secondary | ICD-10-CM

## 2024-07-26 DIAGNOSIS — J383 Other diseases of vocal cords: Secondary | ICD-10-CM

## 2024-07-26 DIAGNOSIS — R490 Dysphonia: Secondary | ICD-10-CM | POA: Diagnosis not present

## 2024-07-26 DIAGNOSIS — M270 Developmental disorders of jaws: Secondary | ICD-10-CM

## 2024-07-26 MED ORDER — AZELASTINE HCL 0.1 % NA SOLN: 2.0000 | Freq: Every evening | NASAL | 12 refills | Status: AC

## 2024-07-26 NOTE — Patient Instructions (Signed)
 Use two sprays of flonase in each nostril nightly; right after, use astelin  spray two sprays each nostril twice per day Use reflux gourmet after meals and before bed Can use salt water gargles before bed Improve your hydration   Aureliano Med Nasal Saline Rinse - use daily - start nasal saline rinses with NeilMed Bottle available over the counter    Nasal Saline Irrigation instructions: If you choose to make your own salt water solution, You will need: Salt (kosher, canning, or pickling salt) Baking soda Nasal irrigation bottle (i.e. Aureliano Med Sinus Rinse) Measuring spoon ( teaspoon) Distilled / boiled water   Mix solution Mix 1 teaspoon of salt, 1/2 teaspoon of baking soda and 1 cup of water into irrigation bottle ** May use saline packet instead of homemade recipe for this step if you prefer If medicine was prescribed to be mixed with solution, place this into bottle Examples 2 inches of 2% mupirocin  ointment Budesonide solution Position your head: Lean over sink (about 45 degrees) Rotate head (about 45 degrees) so that one nostril is above the other Irrigate Insert tip of irrigation bottle into upper nostril so it forms a comfortable seal Irrigate while breathing through your mouth May remove the straw from the bottle in order to irrigate the entire solution (important if medicine was added) Exhale through nose when finished and blow nose as necessary  Repeat on opposite side with other 1/2 of solution (120 mL) or remake solution if all 240 mL was used on first side Wash irrigation bottle regularly, replace every 3 months

## 2024-07-26 NOTE — Progress Notes (Signed)
 Dear Dr. Dwight, Here is my assessment for our mutual patient, Mary Key. Thank you for allowing me the opportunity to care for your patient. Please do not hesitate to contact me should you have any other questions. Sincerely, Dr. Eldora Blanch  Otolaryngology Clinic Note Referring provider: Dr. Dwight HPI:  Initial visit (07/2024): Discussed the use of AI scribe software for clinical note transcription with the patient, who gave verbal consent to proceed.  History of Present Illness Mary Key is a 68 year old female who presents with several issues, including some dry mouth, postnasal drip and hoarseness with LPR. She was referred by Dr. Dwight for evaluation  She experiences chronic postnasal drip and hoarseness, with her voice becoming 'raspy' and worsening with use, particularly by the end of the day. She cannot talk loudly or shout because it will cause her to lose her voice. Never fully aphonic. Rest helps. She does attribute some of these symptoms to her PND, which causes her to be more hoarse and she has a morning cough as a result, but no cough during the day. She uses a generic Flonase spray, which provides some relief. No typical AR symptoms or frequent sinus issues.  Does report h/o LPR and GERD for which she was on PPI but has since stopped, now intermittent burning symptoms.   She experiences dry mouth and eyes which started after her RAI. Tries to stay hydrated best she can but does bother her some. She underwent thyroidectomy and radioactive iodine treatment nearly ten years ago.   No significant throat clearing.  In addition, she reports that her oral surgeon has done a sleep test (unable to review) which showed mild sleep apnea. He also reported that she has mandibular and maxillary tori for which she is consider resection to help with her tongue position in hopes it may improve her OSA. She wishes for a second opinion regarding this as she is considering this.   No recent  intubations. Patient otherwise denies: - odynophagia, aspiration episodes or PNA, need for Heimlich, unintentional weight loss - changes in voice, shortness of breath, hemoptysis - deep ear pain, neck masses   Relevant ENT Surgery: Thyroidectomy and central neck ND (2016) Personal or FHx of bleeding dz or anesthesia difficulty: no  GLP-1: no AP/AC: no  Tobacco: no PMHx: Thyroid  Carcinoma, Chronic Pain, MDD, Hypocalcemia, HCV, Insomnia  Independent Review of Additional Tests or Records:  Dr. Lucienne (11/27/2022): noted total thyroidectomy and RAI in 2016. T3N1A classic PTC, minimal ETE, 2/8 LN positive central neck. Noted suppressed.  US  Thyroid Mary Key 11/2021: noted no noted LAD and no locally recurrent disease Dr. Dwight Referral notes reviewed and uploaded or available in chart in media tab (06/15/2024): noted PND, hoarseness, dysphagia; noted dysphonia and large tori, Oral surgeon recommending removal PMH/Meds/All/SocHx/FamHx/ROS:   Past Medical History:  Diagnosis Date   ADD (attention deficit disorder)    Anxiety    Arthritis    Depression    Difficulty sleeping    takes ambien  PRN   GERD (gastroesophageal reflux disease)    eats carefully    Hx of hepatitis C    cured with medication   Thyroid  cancer [facility])      Past Surgical History:  Procedure Laterality Date   THYROIDECTOMY N/A 06/13/2015   Procedure: TOTAL THYROIDECTOMY;  Surgeon: Krystal Spinner, MD;  Location: [facility];  Service: General;  Laterality: N/A;   TONSILLECTOMY      Family History  Problem Relation Age of Onset   Thyroid   disease Mother    Neuropathy Mother        Peripheral Neuropathy   Cancer - Other Father        Cancer in bladder     Social Connections: Not on file      Current Outpatient Medications:    AUVI-Q  0.3 MG/0.3ML SOAJ injection, Use as directed for life-threatening allergic reaction., Disp: 2 each, Rfl: 1   azelastine  (ASTELIN ) 0.1 % nasal spray, Place 2 sprays into both nostrils at  bedtime. Use in each nostril as directed, Disp: 30 mL, Rfl: 12   CALCIUM  CARB-VIT D-A-K-B2-SOD PO, , Disp: , Rfl:    Creatine Monohydrate 1.5 GM/15ML LIQD, , Disp: , Rfl:    estradiol (ESTRACE VAGINAL) 0.1 MG/GM vaginal cream, Place 1 Applicatorful vaginally 2 (two) times a week. , Disp: , Rfl:    Levothyroxine  Sodium 100 MCG CAPS, Take 1 tablet by mouth daily., Disp: , Rfl:    Omega-3 Fatty Acids (OMEGA 3 500 PO), 1,700 mg., Disp: , Rfl:    tacrolimus (PROTOPIC) 0.1 % ointment, Apply 1 Application topically 2 (two) times daily., Disp: , Rfl:    Testosterone 10 MG/ACT (2%) GEL, Apply 0.5 ml (2 clicks) to inner arms (can use on clitoris, labia, vagina as needed) each morning up to 6 days., Disp: , Rfl:    tretinoin (RETIN-A) 0.05 % cream, SMARTSIG:sparingly Topical Every Night PRN, Disp: , Rfl:    zolpidem  (AMBIEN ) 5 MG tablet, Take 5 mg by mouth at bedtime as needed for sleep., Disp: , Rfl:    Physical Exam:   BP (!) 155/68 (BP Location: Left Arm, Patient Position: Sitting, Cuff Size: Normal)   Pulse 72   Ht 5' 2 (1.575 m)   Wt 150 lb (68 kg)   SpO2 96%   BMI 27.44 kg/m   Salient findings:  CN II-XII intact Bilateral EAC clear and TM intact with well pneumatized middle ear spaces Anterior rhinoscopy: Septum intact; bilateral inferior turbinates without significant hypertrophy No lesions of oral cavity/oropharynx; dentition fair; noted bilateral fairly significant mandibular tori and torus palatini No obviously palpable neck masses/lymphadenopathy/thyromegaly No respiratory distress or stridor; voice quality class 2.5; Stroboscopy was indicated to better evaluate the proximal airway, given the patient's history and exam findings, and is detailed below.   Seprately Identifiable Procedures:  Prior to initiating any procedures, risks/benefits/alternatives were explained to the patient and verbal consent obtained. Procedure Note Pre-procedure diagnosis:  Dysphonia Post-procedure  diagnosis: Same Procedure: Video Laryngostroboscopy; CPT [phone] - Mod 25 Indication: See pre-procedure diagnosis:   The procedure was undertaken to further evaluate the patient's complaint above; a transnasal video laryngostroboscopy (VLS) was performed to closely evaluate the patient's laryngeal biomechanics and vocal fold oscillation.   Patient was identified as correct patient. Verbal consent was obtained. The nose was sprayed bilaterally as below. The flexible scope was passed through the nose to view the nasal cavity, pharynx (oropharynx, hypopharynx) and larynx.  The larynx was examined at rest and during multiple phonatory tasks. Documentation was obtained and reviewed with patient. The scope was removed. The patient tolerated the procedure well.  VLS Exam Detail:  Amplitude - Left: Slightly diminished Amplitude - Right: Slightly diminished  Mucosal Wave - Left: normal Mucosal Wave - Right: normal  Vocal Fold Motion - Left: No motion abnormalities Vocal Fold Motion - Right: No motion abnormalities  Vertical Level of Approximation: equal Glottic Closure: complete  Phase Symmetry: symmetric Periodicity (Regularity): regular  Supraglottic Hyperfunction: mixed Nasopharynx: Bilateral tori wnl, no masses  or lesions  Airway: Widely Patent Additional Observations: none   Exam Summary: Examiner: Eldora Blanch, MD Diagnostician: Eldora Blanch, MD  Anesthesia: Yes, Topical (oxymetazoline and 4% lidocaine ) Sensitivity to Scope:  minimal   Summary of Findings: The videostroboscopic exam revealed A-P and lateral compression of the supraglottis with phonation. There were no masses, lesions, or vocal fold motion abnormalities on exam. Noted 1 mm glottal gap with age appropriate vocal fold atrophy  Electronically signed by: Eldora KATHEE Blanch, MD 07/26/2024 2:39 PM   Impression & Plans:  Mary Key is a 68 y.o. female with:  1. Dysphonia   2. Vocal fold atrophy   3. Glottic insufficiency   4. Muscle  tension dysphonia   5. Other cough   6. Post-nasal drip   7. Laryngopharyngeal reflux (LPR)   8. Torus mandibularis   9. Torus palatinus    Multiple issues today: - Mandibular and maxillary tori evaluation with mild sleep apnea Mandibular and maxillary tori present. We discussed that mandibular tori may predict sleep apnea but removal may not be necessary and may not improve sleep apnea --- recent article in Oral Health/review discussed; she wishes to avoid removal  Dysphonia - likely multifactorial -- dryness, vocal fold atrophy, MTD with small glottal gap No polyps or nodules observed. She prefers to avoid invasive procedures. - Improve hydration. - Consider voice therapy if dysphonia becomes bothersome.  Dry mouth secondary to prior radioactive iodine therapy - Likely secondary to prior radioactive iodine therapy affecting salivary glands. - Improve hydration. - Perform salt water gargles before bed. - Can consider biotene mouthwash; if refractory, consider salagen  Postnasal drip and nasal congestion - Current use of generic Flonase provides some relief. - Prescribed Astelin  nasal spray, two sprays in each nostril as needed; continue flonase BID - Discussed this may not necessarily contribute to dysphonia and morning cough likely due to dry secretions/mouth  Gastroesophageal reflux disease GERD with mild symptoms, previously managed with Prilosec and Nexium. D/w pt re: possible PND contribution Try reflux gourmet after meals  D/w pt f/u, she will call if not improving  See below regarding exact medications prescribed this encounter including dosages and route: Meds ordered this encounter  Medications   azelastine  (ASTELIN ) 0.1 % nasal spray    Sig: Place 2 sprays into both nostrils at bedtime. Use in each nostril as directed    Dispense:  30 mL    Refill:  12      Thank you for allowing me the opportunity to care for your patient. Please do not hesitate to contact me  should you have any other questions.  Sincerely, Eldora Blanch, MD Otolaryngologist (ENT), [facility] Phone: [phone] Fax: [phone]  07/26/2024, 2:39 PM   I have personally spent 60 minutes involved in face-to-face and non-face-to-face activities for this patient on the day of the visit.  Professional time spent excludes any procedures performed but includes the following activities, in addition to those noted in the documentation: preparing to see the patient (review of outside documentation and results), performing a medically appropriate examination, counseling, ordering medications (astelin ), documenting in the electronic health record, discussion and counseling regarding multiple medical problems

## 2024-12-01 ENCOUNTER — Ambulatory Visit: Admitting: Family Medicine
